# Patient Record
Sex: Female | Born: 1985 | Race: Asian | Hispanic: No | Marital: Single | Smoking: Current some day smoker
Health system: Southern US, Community
[De-identification: ages and names within clinical notes are randomized; demographics above are authoritative.]

## PROBLEM LIST (undated history)

## (undated) DIAGNOSIS — R011 Cardiac murmur, unspecified: Secondary | ICD-10-CM

## (undated) DIAGNOSIS — F419 Anxiety disorder, unspecified: Secondary | ICD-10-CM

## (undated) DIAGNOSIS — E785 Hyperlipidemia, unspecified: Secondary | ICD-10-CM

## (undated) DIAGNOSIS — F329 Major depressive disorder, single episode, unspecified: Secondary | ICD-10-CM

## (undated) DIAGNOSIS — F32A Depression, unspecified: Secondary | ICD-10-CM

## (undated) DIAGNOSIS — T7840XA Allergy, unspecified, initial encounter: Secondary | ICD-10-CM

## (undated) DIAGNOSIS — I10 Essential (primary) hypertension: Secondary | ICD-10-CM

## (undated) HISTORY — DX: Major depressive disorder, single episode, unspecified: F32.9

## (undated) HISTORY — PX: PREPATELLAR BURSA EXCISION: SUR547

## (undated) HISTORY — DX: Cardiac murmur, unspecified: R01.1

## (undated) HISTORY — DX: Hyperlipidemia, unspecified: E78.5

## (undated) HISTORY — DX: Morbid (severe) obesity due to excess calories: E66.01

## (undated) HISTORY — DX: Depression, unspecified: F32.A

## (undated) HISTORY — DX: Essential (primary) hypertension: I10

## (undated) HISTORY — PX: EYE SURGERY: SHX253

## (undated) HISTORY — DX: Allergy, unspecified, initial encounter: T78.40XA

---

## 2006-12-22 ENCOUNTER — Ambulatory Visit: Payer: Self-pay | Admitting: Family Medicine

## 2006-12-24 ENCOUNTER — Ambulatory Visit: Payer: Self-pay | Admitting: *Deleted

## 2007-02-02 ENCOUNTER — Ambulatory Visit: Payer: Self-pay | Admitting: Family Medicine

## 2007-04-14 ENCOUNTER — Ambulatory Visit: Payer: Self-pay | Admitting: Internal Medicine

## 2007-07-27 ENCOUNTER — Encounter (INDEPENDENT_AMBULATORY_CARE_PROVIDER_SITE_OTHER): Payer: Self-pay | Admitting: *Deleted

## 2007-07-28 ENCOUNTER — Ambulatory Visit: Payer: Self-pay | Admitting: Internal Medicine

## 2007-07-28 LAB — CONVERTED CEMR LAB
HDL: 58 mg/dL (ref >39)
LDL Cholesterol: 127 mg/dL — ABNORMAL HIGH (ref 0–99)
Triglycerides: 85 mg/dL (ref <150)
VLDL: 17 mg/dL (ref 0–40)

## 2007-08-02 ENCOUNTER — Ambulatory Visit: Payer: Self-pay | Admitting: Internal Medicine

## 2007-10-27 ENCOUNTER — Ambulatory Visit: Payer: Self-pay | Admitting: Internal Medicine

## 2007-10-27 ENCOUNTER — Encounter (INDEPENDENT_AMBULATORY_CARE_PROVIDER_SITE_OTHER): Payer: Self-pay | Admitting: Family Medicine

## 2007-10-27 LAB — CONVERTED CEMR LAB
Cholesterol: 222 mg/dL — ABNORMAL HIGH (ref 0–200)
HDL: 65 mg/dL (ref >39)
LDL Cholesterol: 147 mg/dL — ABNORMAL HIGH (ref 0–99)
Total CHOL/HDL Ratio: 3.4
Triglycerides: 49 mg/dL (ref <150)
VLDL: 10 mg/dL (ref 0–40)

## 2008-01-24 ENCOUNTER — Ambulatory Visit: Payer: Self-pay | Admitting: Internal Medicine

## 2008-01-24 ENCOUNTER — Encounter (INDEPENDENT_AMBULATORY_CARE_PROVIDER_SITE_OTHER): Payer: Self-pay | Admitting: Family Medicine

## 2008-01-24 LAB — CONVERTED CEMR LAB
ALT: 14 units/L (ref 0–35)
BUN: 9 mg/dL (ref 6–23)
Basophils Absolute: 0 10*3/uL (ref 0.0–0.1)
Basophils Relative: 0 % (ref 0–1)
CO2: 22 meq/L (ref 19–32)
Chloride: 100 meq/L (ref 96–112)
Eosinophils Absolute: 0 10*3/uL (ref 0.0–0.7)
Eosinophils Relative: 1 % (ref 0–5)
Glucose, Bld: 99 mg/dL (ref 70–99)
LDL Cholesterol: 78 mg/dL (ref 0–99)
Lymphs Abs: 1.9 10*3/uL (ref 0.7–4.0)
MCV: 88.3 fL (ref 78.0–100.0)
Monocytes Absolute: 0.4 10*3/uL (ref 0.1–1.0)
Neutrophils Relative %: 65 % (ref 43–77)
RBC: 4.7 M/uL (ref 3.87–5.11)
Sodium: 137 meq/L (ref 135–145)
Total CHOL/HDL Ratio: 2.5
Total Protein: 7.6 g/dL (ref 6.0–8.3)
Triglycerides: 68 mg/dL (ref <150)
VLDL: 14 mg/dL (ref 0–40)
WBC: 6.7 10*3/uL (ref 4.0–10.5)

## 2008-07-30 ENCOUNTER — Emergency Department (HOSPITAL_COMMUNITY): Admission: EM | Admit: 2008-07-30 | Discharge: 2008-07-30 | Payer: Self-pay | Admitting: Emergency Medicine

## 2008-09-04 ENCOUNTER — Encounter (INDEPENDENT_AMBULATORY_CARE_PROVIDER_SITE_OTHER): Payer: Self-pay | Admitting: Adult Health

## 2008-09-04 ENCOUNTER — Ambulatory Visit: Payer: Self-pay | Admitting: Internal Medicine

## 2008-09-04 LAB — CONVERTED CEMR LAB
Alkaline Phosphatase: 76 units/L (ref 39–117)
BUN: 12 mg/dL (ref 6–23)
CO2: 21 meq/L (ref 19–32)
Chloride: 100 meq/L (ref 96–112)
Eosinophils Relative: 1 % (ref 0–5)
Glucose, Bld: 87 mg/dL (ref 70–99)
HCT: 39.9 % (ref 36.0–46.0)
Lymphs Abs: 2.2 10*3/uL (ref 0.7–4.0)
MCHC: 33.8 g/dL (ref 30.0–36.0)
MCV: 85.3 fL (ref 78.0–100.0)
Monocytes Absolute: 0.4 10*3/uL (ref 0.1–1.0)
Neutrophils Relative %: 62 % (ref 43–77)
Potassium: 4.1 meq/L (ref 3.5–5.3)
RBC: 4.68 M/uL (ref 3.87–5.11)
RDW: 13 % (ref 11.5–15.5)
Sodium: 138 meq/L (ref 135–145)
TSH: 1.725 microintl units/mL (ref 0.350–4.50)
Total Protein: 7.4 g/dL (ref 6.0–8.3)
WBC: 7 10*3/uL (ref 4.0–10.5)

## 2008-09-24 ENCOUNTER — Ambulatory Visit: Payer: Self-pay | Admitting: Internal Medicine

## 2008-09-25 ENCOUNTER — Ambulatory Visit: Payer: Self-pay | Admitting: Internal Medicine

## 2008-10-08 ENCOUNTER — Emergency Department (HOSPITAL_COMMUNITY): Admission: EM | Admit: 2008-10-08 | Discharge: 2008-10-08 | Payer: Self-pay | Admitting: Emergency Medicine

## 2008-10-10 ENCOUNTER — Inpatient Hospital Stay (HOSPITAL_COMMUNITY): Admission: AD | Admit: 2008-10-10 | Discharge: 2008-10-10 | Payer: Self-pay | Admitting: Obstetrics & Gynecology

## 2008-10-10 ENCOUNTER — Ambulatory Visit: Payer: Self-pay | Admitting: Physician Assistant

## 2008-10-11 ENCOUNTER — Ambulatory Visit: Payer: Self-pay | Admitting: Internal Medicine

## 2008-10-18 ENCOUNTER — Ambulatory Visit: Payer: Self-pay | Admitting: Internal Medicine

## 2008-10-25 ENCOUNTER — Ambulatory Visit: Payer: Self-pay | Admitting: Internal Medicine

## 2008-11-13 ENCOUNTER — Ambulatory Visit: Payer: Self-pay | Admitting: Internal Medicine

## 2008-11-21 ENCOUNTER — Ambulatory Visit: Payer: Self-pay | Admitting: Internal Medicine

## 2008-11-22 ENCOUNTER — Ambulatory Visit: Payer: Self-pay | Admitting: Obstetrics and Gynecology

## 2008-11-27 ENCOUNTER — Ambulatory Visit: Payer: Self-pay | Admitting: Internal Medicine

## 2008-11-29 ENCOUNTER — Ambulatory Visit (HOSPITAL_COMMUNITY): Admission: RE | Admit: 2008-11-29 | Discharge: 2008-11-29 | Payer: Self-pay | Admitting: Family Medicine

## 2009-01-03 ENCOUNTER — Ambulatory Visit: Payer: Self-pay | Admitting: Internal Medicine

## 2009-01-03 ENCOUNTER — Encounter (INDEPENDENT_AMBULATORY_CARE_PROVIDER_SITE_OTHER): Payer: Self-pay | Admitting: Adult Health

## 2009-01-03 LAB — CONVERTED CEMR LAB
ALT: 12 U/L
AST: 11 U/L
Albumin: 4.1 g/dL
Alkaline Phosphatase: 66 U/L
BUN: 11 mg/dL
Basophils Absolute: 0 K/uL
Basophils Relative: 0 %
CO2: 20 meq/L
Calcium: 9 mg/dL
Chloride: 103 meq/L
Cholesterol: 145 mg/dL
Creatinine, Ser: 0.46 mg/dL
Eosinophils Absolute: 0.1 K/uL
Eosinophils Relative: 1 %
Glucose, Bld: 115 mg/dL — ABNORMAL HIGH
HCT: 39 %
HDL: 63 mg/dL
Hemoglobin: 12.6 g/dL
LDL Cholesterol: 68 mg/dL
Lymphocytes Relative: 31 %
Lymphs Abs: 2 K/uL
MCHC: 32.3 g/dL
MCV: 87.2 fL
Monocytes Absolute: 0.5 K/uL
Monocytes Relative: 7 %
Neutro Abs: 4 K/uL
Neutrophils Relative %: 61 %
Platelets: 353 K/uL
Potassium: 4.4 meq/L
RBC: 4.47 M/uL
RDW: 13.5 %
Sodium: 138 meq/L
TSH: 1.622 u[IU]/mL
Total Bilirubin: 0.4 mg/dL
Total CHOL/HDL Ratio: 2.3
Total Protein: 6.9 g/dL
Triglycerides: 71 mg/dL
VLDL: 14 mg/dL
WBC: 6.6 10*3/microliter

## 2009-01-17 ENCOUNTER — Ambulatory Visit: Payer: Self-pay | Admitting: Family Medicine

## 2009-02-01 ENCOUNTER — Ambulatory Visit: Payer: Self-pay | Admitting: Internal Medicine

## 2009-05-03 ENCOUNTER — Ambulatory Visit: Payer: Self-pay | Admitting: Internal Medicine

## 2009-06-12 ENCOUNTER — Encounter: Admission: RE | Admit: 2009-06-12 | Discharge: 2009-09-10 | Payer: Self-pay | Admitting: Adult Health

## 2009-08-02 ENCOUNTER — Encounter (INDEPENDENT_AMBULATORY_CARE_PROVIDER_SITE_OTHER): Payer: Self-pay | Admitting: Adult Health

## 2009-08-02 ENCOUNTER — Ambulatory Visit: Payer: Self-pay | Admitting: Internal Medicine

## 2009-08-02 LAB — CONVERTED CEMR LAB
ALT: 71 units/L — ABNORMAL HIGH (ref 0–35)
AST: 64 units/L — ABNORMAL HIGH (ref 0–37)
BUN: 12 mg/dL (ref 6–23)
CO2: 21 meq/L (ref 19–32)
Calcium: 9.4 mg/dL (ref 8.4–10.5)
Creatinine, Ser: 0.57 mg/dL (ref 0.40–1.20)
HDL: 47 mg/dL (ref >39)
Total Bilirubin: 0.4 mg/dL (ref 0.3–1.2)
Total Protein: 6.8 g/dL (ref 6.0–8.3)
Triglycerides: 101 mg/dL (ref <150)

## 2009-08-08 ENCOUNTER — Encounter (INDEPENDENT_AMBULATORY_CARE_PROVIDER_SITE_OTHER): Payer: Self-pay | Admitting: Adult Health

## 2009-08-08 LAB — CONVERTED CEMR LAB: Hep A IgM: NEGATIVE

## 2009-08-09 ENCOUNTER — Ambulatory Visit: Payer: Self-pay | Admitting: Family Medicine

## 2009-09-04 ENCOUNTER — Ambulatory Visit: Payer: Self-pay | Admitting: Internal Medicine

## 2009-09-04 ENCOUNTER — Encounter (INDEPENDENT_AMBULATORY_CARE_PROVIDER_SITE_OTHER): Payer: Self-pay | Admitting: Adult Health

## 2009-09-04 LAB — CONVERTED CEMR LAB
AST: 58 units/L — ABNORMAL HIGH (ref 0–37)
Albumin: 3.9 g/dL (ref 3.5–5.2)
BUN: 11 mg/dL (ref 6–23)
Eosinophils Relative: 1 % (ref 0–5)
HCT: 41.2 % (ref 36.0–46.0)
Helicobacter Pylori Antibody-IgG: <0.4
Hemoglobin: 13 g/dL (ref 12.0–15.0)
Lymphocytes Relative: 20 % (ref 12–46)
MCHC: 31.6 g/dL (ref 30.0–36.0)
MCV: 89.6 fL (ref 78.0–100.0)
Monocytes Absolute: 0.5 10*3/uL (ref 0.1–1.0)
Neutrophils Relative %: 73 % (ref 43–77)
Platelets: 365 10*3/uL (ref 150–400)
Potassium: 4.5 meq/L (ref 3.5–5.3)
RBC: 4.6 M/uL (ref 3.87–5.11)
RDW: 13 % (ref 11.5–15.5)
Total Protein: 6.9 g/dL (ref 6.0–8.3)

## 2009-09-24 ENCOUNTER — Ambulatory Visit: Payer: Self-pay | Admitting: Internal Medicine

## 2009-10-29 ENCOUNTER — Ambulatory Visit: Payer: Self-pay | Admitting: Internal Medicine

## 2009-10-29 ENCOUNTER — Encounter (INDEPENDENT_AMBULATORY_CARE_PROVIDER_SITE_OTHER): Payer: Self-pay | Admitting: Adult Health

## 2009-10-29 LAB — CONVERTED CEMR LAB
AST: 39 units/L — ABNORMAL HIGH (ref 0–37)
Alkaline Phosphatase: 85 units/L (ref 39–117)
Cholesterol: 222 mg/dL — ABNORMAL HIGH (ref 0–200)
HDL: 59 mg/dL (ref >39)
Hgb A1c MFr Bld: 8.5 % — ABNORMAL HIGH (ref 4.6–6.1)
Potassium: 4.2 meq/L (ref 3.5–5.3)
Sodium: 136 meq/L (ref 135–145)
Total Bilirubin: 0.5 mg/dL (ref 0.3–1.2)
Triglycerides: 117 mg/dL (ref <150)

## 2009-12-04 ENCOUNTER — Encounter (INDEPENDENT_AMBULATORY_CARE_PROVIDER_SITE_OTHER): Payer: Self-pay | Admitting: Adult Health

## 2009-12-04 ENCOUNTER — Ambulatory Visit: Payer: Self-pay | Admitting: Internal Medicine

## 2009-12-04 LAB — CONVERTED CEMR LAB: Hgb A1c MFr Bld: 10.4 % — ABNORMAL HIGH (ref 4.6–6.1)

## 2009-12-30 ENCOUNTER — Ambulatory Visit: Payer: Self-pay | Admitting: Internal Medicine

## 2009-12-30 ENCOUNTER — Encounter (INDEPENDENT_AMBULATORY_CARE_PROVIDER_SITE_OTHER): Payer: Self-pay | Admitting: Adult Health

## 2009-12-30 LAB — CONVERTED CEMR LAB
AST: 43 units/L — ABNORMAL HIGH (ref 0–37)
CO2: 21 meq/L (ref 19–32)
Glucose, Bld: 207 mg/dL — ABNORMAL HIGH (ref 70–99)
Potassium: 4.4 meq/L (ref 3.5–5.3)
Sodium: 136 meq/L (ref 135–145)
Total Bilirubin: 0.4 mg/dL (ref 0.3–1.2)

## 2010-01-14 ENCOUNTER — Ambulatory Visit: Payer: Self-pay | Admitting: Internal Medicine

## 2010-03-04 ENCOUNTER — Ambulatory Visit: Payer: Self-pay | Admitting: Internal Medicine

## 2010-03-18 ENCOUNTER — Ambulatory Visit: Payer: Self-pay | Admitting: Internal Medicine

## 2010-03-27 ENCOUNTER — Encounter: Admission: RE | Admit: 2010-03-27 | Discharge: 2010-03-27 | Payer: Self-pay | Admitting: Adult Health

## 2010-04-01 ENCOUNTER — Ambulatory Visit: Payer: Self-pay | Admitting: Internal Medicine

## 2010-05-28 ENCOUNTER — Ambulatory Visit: Payer: Self-pay | Admitting: Internal Medicine

## 2010-07-30 ENCOUNTER — Emergency Department (HOSPITAL_COMMUNITY): Admission: EM | Admit: 2010-07-30 | Discharge: 2010-07-30 | Payer: Self-pay | Admitting: Family Medicine

## 2010-09-29 ENCOUNTER — Encounter (INDEPENDENT_AMBULATORY_CARE_PROVIDER_SITE_OTHER): Payer: Self-pay | Admitting: *Deleted

## 2010-09-29 LAB — CONVERTED CEMR LAB
Albumin: 4.1 g/dL (ref 3.5–5.2)
BUN: 8 mg/dL (ref 6–23)
Calcium: 9.5 mg/dL (ref 8.4–10.5)
Chloride: 103 meq/L (ref 96–112)
Hemoglobin: 14 g/dL (ref 12.0–15.0)
LDL Cholesterol: 127 mg/dL — ABNORMAL HIGH (ref 0–99)
MCV: 83.1 fL (ref 78.0–100.0)
Platelets: 402 10*3/uL — ABNORMAL HIGH (ref 150–400)
Potassium: 4.3 meq/L (ref 3.5–5.3)
RBC: 4.97 M/uL (ref 3.87–5.11)
Sodium: 138 meq/L (ref 135–145)
TSH: 1.785 microintl units/mL (ref 0.350–4.500)
Total Protein: 6.9 g/dL (ref 6.0–8.3)
VLDL: 23 mg/dL (ref 0–40)

## 2010-10-16 ENCOUNTER — Ambulatory Visit (HOSPITAL_COMMUNITY)
Admission: RE | Admit: 2010-10-16 | Discharge: 2010-10-16 | Payer: Self-pay | Source: Home / Self Care | Attending: Family Medicine | Admitting: Family Medicine

## 2010-10-27 ENCOUNTER — Emergency Department (HOSPITAL_COMMUNITY)
Admission: EM | Admit: 2010-10-27 | Discharge: 2010-10-27 | Payer: Self-pay | Source: Home / Self Care | Admitting: Emergency Medicine

## 2010-10-28 ENCOUNTER — Encounter (INDEPENDENT_AMBULATORY_CARE_PROVIDER_SITE_OTHER): Payer: Self-pay | Admitting: *Deleted

## 2010-10-28 LAB — CONVERTED CEMR LAB: HCV Ab: NEGATIVE

## 2010-11-30 ENCOUNTER — Encounter: Payer: Self-pay | Admitting: *Deleted

## 2010-12-05 ENCOUNTER — Emergency Department (HOSPITAL_COMMUNITY)
Admission: EM | Admit: 2010-12-05 | Discharge: 2010-12-05 | Payer: Self-pay | Source: Home / Self Care | Admitting: Family Medicine

## 2010-12-05 LAB — POCT I-STAT, CHEM 8
Creatinine, Ser: 0.9 mg/dL (ref 0.4–1.2)
Potassium: 4.1 mEq/L (ref 3.5–5.1)
Sodium: 131 mEq/L — ABNORMAL LOW (ref 135–145)
TCO2: 16 mmol/L (ref 0–100)

## 2011-02-12 ENCOUNTER — Encounter: Payer: Self-pay | Attending: Adult Health | Admitting: *Deleted

## 2011-02-12 DIAGNOSIS — Z713 Dietary counseling and surveillance: Secondary | ICD-10-CM | POA: Insufficient documentation

## 2011-02-12 DIAGNOSIS — E119 Type 2 diabetes mellitus without complications: Secondary | ICD-10-CM | POA: Insufficient documentation

## 2011-03-16 ENCOUNTER — Ambulatory Visit: Payer: Self-pay | Admitting: *Deleted

## 2011-03-23 ENCOUNTER — Ambulatory Visit: Payer: Self-pay | Admitting: *Deleted

## 2011-03-24 NOTE — Group Therapy Note (Signed)
NAME:  Rita Hogan, Rita Hogan NO.:  [id number]   MEDICAL RECORD NO.:  [id number]          PATIENT TYPE:  WOC   LOCATION:  [facility]                   FACILITY:  [facility]   PHYSICIAN:  Argentina Donovan, MD        DATE OF BIRTH:  10-May-1986   DATE OF SERVICE:  11/22/2008                                  CLINIC NOTE   The patient is a 25 year old Caucasian female who came into the  maternity admissions on December 2nd with severe left lower quadrant  pain.  She had been into [facility] 2 days earlier with the same  complaint, had ultrasound at both places which showed a small 3 cm  ovarian cyst, probably functional, on that side.  The patient is a  morbidly obese patient, being 5 feet 1 inch tall, weighing 277 pounds.  She is virginal and nulliparous.  She has many medical problems  including diabetes, bronchial asthma, depression, PMS symptoms and  gastric reflux.  She works two jobs and lives in a small apartment and  mother lives with her.  Her mother is bipolar, severe, with Crohn's  disease and the patient, I think, does have a stressful life.  She had  irregular periods with severe PMS symptoms up until 2 years ago when she  went on Desogen birth control pills, but lately she has not been  regularly withdrawing, although she states that at least once every  other month she does have a period.  The symptoms of her cyst have  gotten much better and I have discussed functional vs. tumor cysts of  the ovary with the patient.  I told her whether or not to go up in  dosage on her birth control pill would depend on whether or not she had  recurrent episodes of ovarian cysts, but since this is the first episode  that she has had we will keep her on the move the dose and medicine she  is on because the Desogen has controlled her PMS symptoms and has made  her periods less painful and more regular.  We are going to repeat the  ultrasound, it has been 6 weeks since the previous one, and  see if there  is any change.  She will come back in 2 weeks for results and at that  time will decide whether or not we have to up the dose of her oral  contraceptives.   IMPRESSION:  1. Pelvic pain secondary to functional ovarian cyst.  2. History of dysmenorrhea and premenstrual syndrome, controlled by      oral contraceptives.  3. Type 1 diabetes mellitus.  4. Bronchial asthma.  5. Gastric reflux.  6. Emotionally labile with depression.           ______________________________  Argentina Donovan, MD     PR/MEDQ  D:  11/22/2008  T:  11/22/2008  Job:  [id number]

## 2011-04-08 ENCOUNTER — Ambulatory Visit: Payer: Self-pay | Admitting: *Deleted

## 2011-04-13 ENCOUNTER — Ambulatory Visit (INDEPENDENT_AMBULATORY_CARE_PROVIDER_SITE_OTHER): Payer: Self-pay

## 2011-04-13 ENCOUNTER — Inpatient Hospital Stay (INDEPENDENT_AMBULATORY_CARE_PROVIDER_SITE_OTHER)
Admission: RE | Admit: 2011-04-13 | Discharge: 2011-04-13 | Disposition: A | Discharge status: Home / Self Care | Payer: Self-pay | Source: Ambulatory Visit | Attending: Emergency Medicine | Admitting: Emergency Medicine

## 2011-04-13 DIAGNOSIS — S8000XA Contusion of unspecified knee, initial encounter: Secondary | ICD-10-CM

## 2011-04-23 ENCOUNTER — Ambulatory Visit: Payer: Self-pay | Admitting: *Deleted

## 2011-06-15 ENCOUNTER — Ambulatory Visit: Payer: Self-pay | Admitting: *Deleted

## 2011-07-09 ENCOUNTER — Encounter: Payer: Self-pay | Attending: Family Medicine | Admitting: *Deleted

## 2011-07-09 ENCOUNTER — Encounter: Payer: Self-pay | Admitting: *Deleted

## 2011-07-09 DIAGNOSIS — E119 Type 2 diabetes mellitus without complications: Secondary | ICD-10-CM | POA: Insufficient documentation

## 2011-07-09 DIAGNOSIS — Z713 Dietary counseling and surveillance: Secondary | ICD-10-CM | POA: Insufficient documentation

## 2011-07-09 NOTE — Progress Notes (Signed)
  Medical Nutrition Therapy:  Appt start time: 1230 end time:  1300.  Assessment:  Primary concerns today: diabetes management/weight management. Pt has lost 14 lbs since last visit at [facility] in April of 2012. She reports that since school has started she has been eating more regularly and has been walking to class everyday. She recently moved out of a house that was contributing to depression symptoms and poor lifestyles. She feels like she is back on track yet has reported elevated glucose level which are contributing to frequent yeast infections.  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 She is in college, attends classes early, and is currently studying Audiological scientist.  MEDICATIONS: See medications in list. Pt reports that she is out of Novolog/Actos Met and cannot afford to refill it at this time. She is only taking Lantus and Janivia at this point.  DIETARY INTAKE: Pt reports that she is receiving a meal card from the university. She notes that she is not counting carbohydrates yet trying to focus more on portion sizes and meal timing  24-hr recall:  B (6-7 AM): Special K Bar OR Granola Bar w/ yogurt cup  L (12-1 PM): Meal @ Cafeteria at [employer]: Meat, vegetable, starch  OR Deli sandwich w/ chips Snk (4 PM): Granola Bar D (6-8 PM): Shredded cabbage, Rice/Pasta OR Can of soup OR 2 pc wheat bread, egg (2)   Beverages: Crystal Light, Diet soda, water  Recent physical activity: No structured exercise; Walking some on the college campus  Estimated energy needs: 1800-2000 calories 200-225 g carbohydrates 75-80 g protein 50-60 g fat  Lab Results  Component Value Date   HGBA1C 10.4* 12/04/2009   CBG monitoring: "I check whenever I have test strips" Pt reports that she currently can't afford them CBG results: No results reported  Progress Towards Goal(s):  In progress.   Nutritional Diagnosis:  [place]-2.2 Altered nutrition-related laboratory As related to  elevated glucose levels.  As evidenced by glucose levels ranging >200-300 per pt.    Intervention:  Nutrition education.  Monitoring/Evaluation:  Dietary intake, exercise, glucose levels, and body weight in 3 month(s).

## 2011-07-09 NOTE — Patient Instructions (Addendum)
Goals:  Follow Diabetes "Plate Method" Plan  Eat 3 meals and 2 snacks, every 3-5 hrs   Limit carbohydrate intake to 30-45 grams carbohydrate/meal (*When reading labels/restaurants)  Limit carbohydrate intake to 15 grams carbohydrate/snack  Add lean protein foods to meals/snacks   Monitor glucose levels as instructed by your doctor  Aim for 30 mins of physical activity daily  Bring food record and glucose log to your next nutrition visit

## 2011-07-21 ENCOUNTER — Inpatient Hospital Stay (INDEPENDENT_AMBULATORY_CARE_PROVIDER_SITE_OTHER)
Admission: RE | Admit: 2011-07-21 | Discharge: 2011-07-21 | Disposition: A | Discharge status: Home / Self Care | Payer: Self-pay | Source: Ambulatory Visit | Attending: Family Medicine | Admitting: Family Medicine

## 2011-07-21 DIAGNOSIS — L538 Other specified erythematous conditions: Secondary | ICD-10-CM

## 2011-07-21 DIAGNOSIS — N760 Acute vaginitis: Secondary | ICD-10-CM

## 2011-08-10 LAB — GLUCOSE, CAPILLARY: Glucose-Capillary: 104 — ABNORMAL HIGH

## 2011-08-11 LAB — POCT URINALYSIS DIP (DEVICE)
Nitrite: NEGATIVE
pH: 6.5 (ref 5.0–8.0)

## 2011-09-08 ENCOUNTER — Encounter: Payer: Self-pay | Admitting: *Deleted

## 2011-09-08 ENCOUNTER — Encounter: Payer: Self-pay | Attending: Family Medicine | Admitting: *Deleted

## 2011-09-08 DIAGNOSIS — Z713 Dietary counseling and surveillance: Secondary | ICD-10-CM | POA: Insufficient documentation

## 2011-09-08 DIAGNOSIS — E119 Type 2 diabetes mellitus without complications: Secondary | ICD-10-CM | POA: Insufficient documentation

## 2011-09-08 NOTE — Patient Instructions (Addendum)
Goals:  Follow Diabetes "Plate Method" Plan  Eat 3 meals and 2 snacks, every 3-5 hrs   Limit carbohydrate intake to 30-45 grams carbohydrate/meal (*When reading labels/restaurants)  Limit carbohydrate intake to 15 grams carbohydrate/snack  Add lean protein foods to meals/snacks   Monitor glucose levels as instructed by your doctor  Aim for 30 mins of physical activity daily  Bring food record and glucose log to your next nutrition visit 

## 2011-09-08 NOTE — Progress Notes (Signed)
  Medical Nutrition Therapy: Appt start time: 1221 end time: 1254.   Assessment: Primary concerns today: diabetes management/weight management. Pt has lost 16 lbs since last visit at [facility] in April of 2012 and 2 lbs since visit in July 2012.  MEDICATIONS: See medications in list. Pt reports that she is taking her Amaryl, Januvia, and 60 units of Lantus. Pt notes that she cannot afford her test strips, is not checking glucose levels, and is not taking any Novolog. She also notes that she does not want to "gain weight with insulin" which is another reason she does not take it.  DIETARY INTAKE: Rita Hogan reports that she has not been getting her food stamps so she has been limited with what and how much she can buy. She notes that she ran out of money on her meal card at school so she notes that eating has not been great.  24-hr recall:  B (6-7 AM): 2 pkg oatmeal (sugar-sweetened) made with water L (12-1 PM): Soup or Chili (lean beef, beans, tomatoes) Snk (4 PM): Granola Bar OR Chocolate candy OR Baked chips D (6-8 PM): Baked chicken (3 drumsticks), yams (1 cup) OR Pasta (tortillini dishes)  Beverages: Crystal Light, Diet soda, water, coffee w/ sugar-free creamer = 30-40 oz  Recent physical activity: No structured exercise; Walking some on the college campus and to/from bus stop  Estimated energy needs:  1800-2000 calories  200-225 g carbohydrates  75-80 g protein  50-60 g fat   Lab Results  Component Value Date   HGBA1C 10.4* 12/04/2009    CBG monitoring: "I check whenever I have test strips" Pt reports that she currently can't afford them  CBG results: No results reported   Progress Towards Goal(s): In progress.   Nutritional Diagnosis:  -2.2 Altered nutrition-related laboratory As related to elevated glucose levels. As evidenced by glucose levels ranging >200-300 per pt (when checked the 4-5 times over the past 2 months).  Excessive carbohydrate intake related to large portions  of carbohydrate foods as evidenced by pt consuming >75% of total intake from carbohydrate foods.  Intervention: Nutrition education and reinforcement.   Monitoring/Evaluation: Dietary intake, exercise, glucose levels, and body weight in 2-3 month(s).

## 2011-10-08 ENCOUNTER — Other Ambulatory Visit: Payer: Self-pay | Admitting: Internal Medicine

## 2011-10-08 DIAGNOSIS — R51 Headache: Secondary | ICD-10-CM

## 2011-10-12 ENCOUNTER — Ambulatory Visit (HOSPITAL_COMMUNITY)
Admission: RE | Admit: 2011-10-12 | Discharge: 2011-10-12 | Disposition: A | Discharge status: Home / Self Care | Payer: Self-pay | Source: Ambulatory Visit | Attending: Internal Medicine | Admitting: Internal Medicine

## 2011-10-12 DIAGNOSIS — R51 Headache: Secondary | ICD-10-CM | POA: Insufficient documentation

## 2011-10-12 LAB — CREATININE, SERUM: GFR calc non Af Amer: >90 mL/min (ref >90)

## 2011-10-12 MED ORDER — IOHEXOL 300 MG/ML  SOLN
100.0000 mL | Freq: Once | INTRAMUSCULAR | Status: AC | PRN
  Administered 2011-10-12: 80 mL via INTRAVENOUS

## 2012-03-09 ENCOUNTER — Other Ambulatory Visit (HOSPITAL_COMMUNITY): Payer: Self-pay | Admitting: Family Medicine

## 2012-03-09 DIAGNOSIS — M719 Bursopathy, unspecified: Secondary | ICD-10-CM

## 2012-03-14 ENCOUNTER — Other Ambulatory Visit (HOSPITAL_COMMUNITY): Payer: Self-pay

## 2012-03-16 ENCOUNTER — Encounter (HOSPITAL_COMMUNITY): Payer: Self-pay

## 2012-03-16 ENCOUNTER — Ambulatory Visit (HOSPITAL_COMMUNITY)
Admission: RE | Admit: 2012-03-16 | Discharge: 2012-03-16 | Disposition: A | Discharge status: Home / Self Care | Payer: Self-pay | Source: Ambulatory Visit | Attending: Family Medicine | Admitting: Family Medicine

## 2012-03-16 ENCOUNTER — Emergency Department (HOSPITAL_COMMUNITY)
Admission: EM | Admit: 2012-03-16 | Discharge: 2012-03-16 | Disposition: A | Discharge status: Home / Self Care | Payer: Self-pay | Source: Home / Self Care | Attending: Emergency Medicine | Admitting: Emergency Medicine

## 2012-03-16 DIAGNOSIS — M25469 Effusion, unspecified knee: Secondary | ICD-10-CM | POA: Insufficient documentation

## 2012-03-16 DIAGNOSIS — M65839 Other synovitis and tenosynovitis, unspecified forearm: Secondary | ICD-10-CM

## 2012-03-16 DIAGNOSIS — Z9181 History of falling: Secondary | ICD-10-CM | POA: Insufficient documentation

## 2012-03-16 DIAGNOSIS — M25569 Pain in unspecified knee: Secondary | ICD-10-CM | POA: Insufficient documentation

## 2012-03-16 DIAGNOSIS — M719 Bursopathy, unspecified: Secondary | ICD-10-CM

## 2012-03-16 MED ORDER — METHYLPREDNISOLONE ACETATE 40 MG/ML IJ SUSP
40.0000 mg | Freq: Once | INTRAMUSCULAR | Status: AC
  Administered 2012-03-16: 40 mg via INTRA_ARTICULAR

## 2012-03-16 MED ORDER — METHYLPREDNISOLONE ACETATE 40 MG/ML IJ SUSP
INTRAMUSCULAR | Status: AC
  Filled 2012-03-16: qty 5

## 2012-03-16 NOTE — ED Notes (Signed)
C/o rt wrist pain since March.  States has seen her PCP, using ice and ibuprofen without improvement.  Denies any injury

## 2012-03-16 NOTE — ED Provider Notes (Signed)
Chief Complaint  Patient presents with  . Wrist Pain    History of Present Illness:   The patient is a 26 year old female who has a history of pain in the right wrist since March. She denies any injury or swelling. The pain is worse with movement of the thumb. There is no numbness or tingling. The pain is localized over the extensor tendon of the thumb, particularly over the radial styloid. [facility], a week ago, and was told to use ice and anti-inflammatories, but then she was told that she had stage I chronic kidney disease. She is a diabetic and is on insulin.  Review of Systems:  Other than noted above, the patient denies any of the following symptoms: Systemic:  No fevers, chills, sweats, or aches.  No fatigue or tiredness. Musculoskeletal:  No joint pain, arthritis, bursitis, swelling, back pain, or neck pain. Neurological:  No muscular weakness, paresthesias, headache, or trouble with speech or coordination.  No dizziness.   PMFSH:  Past medical history, family history, social history, meds, and allergies were reviewed.  Physical Exam:   Vital signs:  BP 122/68  Pulse 94  Temp(Src) 98.8 F (37.1 C) (Oral)  Resp 20  SpO2 100%  LMP 02/29/2012 Gen:  Alert and oriented times 3.  In no distress. Musculoskeletal:  There was pain to palpation over the extensor tendon of the thumb overlying the radial styloid. Finkelstein's test was positive. The wrist has a full range of motion. There was no pain to palpation over the carpal tunnel.  Otherwise, all joints had a full a ROM with no swelling, bruising or deformity.  No edema, pulses full. Extremities were warm and pink.  Capillary refill was brisk.  Skin:  Clear, warm and dry.  No rash. Neuro:  Alert and oriented times 3.  Muscle strength was normal.  Sensation was intact to light touch.   Procedure Note:  Verbal informed consent was obtained from the patient.  Risks and benefits were outlined with  the patient.  Patient understands and accepts these risks.  Identity of the patient was confirmed verbally and by armband.    Procedure was performed as followed:  The point of maximal tenderness was identified overlying the radial styloid. The area was prepped with Betadine and alcohol and anesthetized with ethyl chloride spray. 1 cc of Depo-Medrol 40 mg strength and 1 cc of 2% Xylocaine were injected overlying the extensor tendon of the thumb and the patient tolerated this well without any complications.  Patient tolerated the procedure well without any immediate complications.   Assessment:  The encounter diagnosis was Hand tendonitis. She has declined his synovitis.  Plan:   1.  The following meds were prescribed:   New Prescriptions   No medications on file   2.  The patient was instructed in symptomatic care, including rest and activity, elevation, application of ice and compression.  Appropriate handouts were given. 3.  The patient was told to return if becoming worse in any way, if no better in 3 or 4 days, and given some red flag symptoms that would indicate earlier return.   4.  The patient was told to follow up if no better in 2 weeks.  At that point she would probably need a referral to a hand surgeon.   [name], MD 03/16/12 [id number]

## 2012-03-16 NOTE — Discharge Instructions (Signed)
Tendinitis  Tendinitis is swelling and inflammation of the tendons. Tendons are band-like tissues that connect muscle to bone. Tendinitis commonly occurs in the:    Shoulders (rotator cuff).   Heels (Achilles tendon).   Elbows (triceps tendon).  CAUSES  Tendinitis is usually caused by overusing the tendon, muscles, and joints involved. When the tissue surrounding a tendon (synovium) becomes inflamed, it is called tenosynovitis. Tendinitis commonly develops in people whose jobs require repetitive motions.  SYMPTOMS   Pain.   Tenderness.   Mild swelling.  DIAGNOSIS  Tendinitis is usually diagnosed by physical exam. Your caregiver may also order X-rays or other imaging tests.  TREATMENT  Your caregiver may recommend certain medicines or exercises for your treatment.  HOME CARE INSTRUCTIONS    Use a sling or splint for as long as directed by your caregiver until the pain decreases.   Put ice on the injured area.   Put ice in a plastic bag.   Place a towel between your skin and the bag.   Leave the ice on for 15 to 20 minutes, 3 to 4 times a day.   Avoid using the limb while the tendon is painful. Perform gentle range of motion exercises only as directed by your caregiver. Stop exercises if pain or discomfort increase, unless directed otherwise by your caregiver.   Only take over-the-counter or prescription medicines for pain, discomfort, or fever as directed by your caregiver.  SEEK MEDICAL CARE IF:    Your pain and swelling increase.   You develop new, unexplained symptoms, especially increased numbness in the hands.  MAKE SURE YOU:    Understand these instructions.   Will watch your condition.   Will get help right away if you are not doing well or get worse.  Document Released: 10/23/2000 Document Revised: 10/15/2011 Document Reviewed: 01/12/2011  [employer] Patient Information 2012 [employer].

## 2012-04-13 ENCOUNTER — Encounter: Payer: Self-pay | Admitting: Physician Assistant

## 2012-04-13 ENCOUNTER — Ambulatory Visit (INDEPENDENT_AMBULATORY_CARE_PROVIDER_SITE_OTHER): Payer: Self-pay | Admitting: Physician Assistant

## 2012-04-13 VITALS — BP 130/92 | HR 86 | Temp 98.0°F | Ht 61.25 in | Wt 303.8 lb

## 2012-04-13 DIAGNOSIS — Z124 Encounter for screening for malignant neoplasm of cervix: Secondary | ICD-10-CM

## 2012-04-13 NOTE — Patient Instructions (Addendum)
Pap Test A Pap test is a sampling of cells from a woman's cervix. The cervix is the opening between the vagina (birth canal) and the uterus (the bottom part of the womb). The cells are scraped from the cervix during a pelvic exam. These cells are then looked at under a microscope to see if the cells are normal or to see if a cancer is developing or there are changes that suggest a cancer will develop. Cervical dysplasia is a condition in which a woman has abnormal changes in the top layer of cells of her cervix. These changes are an early sign that cervical cancer may develop. Pap tests also look for the human papilloma virus (HPV) because it has 4 types that are responsible for 70% of cervical cancer. Infections can also be found during a Pap test such as bacteria, fungus, protozoa and viruses.  Cervical cancer is harder to treat and less likely to have a good outcome if left untreated. Catching the disease at an early stage leads to a better outcome. Since the Pap test was introduced 60 years ago, deaths from cervical cancer have decreased by 70%. Every woman should keep up to date with Pap tests. RISK FACTORS FOR CERVICAL CANCER INCLUDE:   Becoming sexually active before age 69.   Being the daughter of a woman who took diethylstilbestrol (DES) during pregnancy.   Having a sexual partner who has or has had cancer of the penis.   Having a sexual partner whose past partner had cervical cancer or cervical dysplasia (early cell changes which suggest a cancer may develop).   Having a weakened immune system. An example would be HIV or other immunodeficiency disorder.   Having had a sexually transmitted infection such as chlamydia, gonorrhea or HPV.   Having had an abnormal Pap or cancer of the vagina or vulva.   Having had more than one sexual partner.   A history of cervical cancer in a woman's sister or mother.   Not using condoms with new sexual partners.   Smoking.  WHO SHOULD HAVE PAP  TESTS  A Pap test is done to screen for cervical cancer.   The first Pap test should be done at age 50.   Between ages 33 and 77, Pap tests are repeated every 2 years.   Beginning at age 75, you are advised to have a Pap test every 3 years as long as your past 3 Pap tests have been normal.   Some women have medical problems that increase the chance of getting cervical cancer. Talk to your caregiver about these problems. It is especially important to talk to your caregiver if a new problem develops soon after your last Pap test. In these cases, your caregiver may recommend more frequent screening and Pap tests.   The above recommendations are the same for women who have or have not gotten the vaccine for HPV (Human Papillomavirus).   If you had a hysterectomy for a problem that was not a cancer or a condition that could lead to cancer, then you no longer need Pap tests. However, even if you no longer need a Pap test, a regular exam is a good idea to make sure no other problems are starting.    If you are between ages 71 and 24, and you have had normal Pap tests going back 10 years, you no longer need Pap tests. However, even if you no longer need a Pap test, a regular exam is a good idea  to make sure no other problems are starting.    If you have had past treatment for cervical cancer or a condition that could lead to cancer, you need Pap tests and screening for cancer for at least 20 years after your treatment.   If Pap tests have been discontinued, risk factors (such as a new sexual partner) need to be re-assessed to determine if screening should be resumed.   Some women may need screenings more often if they are at high risk for cervical cancer.  PREPARATION FOR A PAP TEST A Pap test should be performed during the weeks before the start of menstruation. Women should not douche or have sexual intercourse for 24 hours before the test. No vaginal creams, diaphragms, or tampons should be  used for 24 hours before the test. To minimize discomfort, a woman should empty her bladder just before the exam. TAKING THE PAP TEST The caregiver will perform a pelvic exam. A metal or plastic instrument (speculum) is placed in the vagina. This is done before your caregiver does a bimanual exam of your internal female organs. This instrument allows your caregiver to see the inside of the vagina and look at the cervix. A small, sterile brush is used to take a sample of cells from the internal opening of the cervix. A small wooden spatula is used to scrape the outside of the cervix. Neither of these two methods to collect cells will cause you pain. These two scrapings are placed on a glass slide or in a small bottle filled with a special liquid. The cells are looked at later under a microscope in a lab. A specialist will look at these cells and determine if the cells are normal. RESULTS OF YOUR PAP TEST  A healthy Pap test shows no abnormal cells or evidence of inflammation.   The presence of abnormally growing cells on the surface of the cervix may be reported as an abnormal Pap test. Different categories of findings are used to describe your Pap test. Your caregiver will go over the importance of these findings with you. The caregiver will then determine what follow-up is needed or when you should have your next pap test.   If you have had two or more abnormal Pap tests:   You may be asked to have a colposcopy. This is a test in which the cervix is viewed with a special lighted microscope.   A cervical tissue sample (biopsy) may also be needed. This involves taking a small tissue sample from the cervix. The sample is looked at under a microscope to find the cause of the abnormal cells. Make sure you find out the results of the Pap test. If you have not received the results within two weeks, contact your caregiver's office for the results. Do not assume everything is normal if you have not heard from  your caregiver or medical facility. It is important to follow up on all of your test results.  Document Released: 01/16/2003 Document Revised: 10/15/2011 Document Reviewed: 10/20/2011 [facility] Patient Information 2012 [place], [place].Intrauterine Device Information An intrauterine device (IUD) is inserted into your uterus and prevents pregnancy. There are 2 types of IUDs available:  Copper IUD. This type of IUD is wrapped in copper wire and is placed inside the uterus. Copper makes the uterus and fallopian tubes produce a fluid that kills sperm. The copper IUD can stay in place for 10 years.   Hormone IUD. This type of IUD contains the hormone progestin (synthetic progesterone).  The hormone thickens the cervical mucus and prevents sperm from entering the uterus, and it also thins the uterine lining to prevent implantation of a fertilized egg. The hormone can weaken or kill the sperm that get into the uterus. The hormone IUD can stay in place for 5 years.  Your caregiver will make sure you are a good candidate for a contraceptive IUD. Discuss with your caregiver the possible side effects. ADVANTAGES  It is highly effective, reversible, long-acting, and low maintenance.   There are no estrogen-related side effects.   An IUD can be used when breastfeeding.   It is not associated with weight gain.   It works immediately after insertion.   The copper IUD does not interfere with your female hormones.   The progesterone IUD can make heavy menstrual periods lighter.   The progesterone IUD can be used for 5 years.   The copper IUD can be used for 10 years.  DISADVANTAGES  The progesterone IUD can be associated with irregular bleeding patterns.   The copper IUD can make your menstrual flow heavier and more painful.   You may experience cramping and vaginal bleeding after insertion.  Document Released: 09/29/2004 Document Revised: 10/15/2011 Document Reviewed: 02/28/2011 [facility] Patient  Information 2012 [place], [place].

## 2012-05-09 ENCOUNTER — Telehealth: Payer: Self-pay

## 2012-05-09 NOTE — Telephone Encounter (Signed)
Pt called returning a phone call she received from the Clinic about test results and would like a call back

## 2012-05-09 NOTE — Telephone Encounter (Signed)
Pt informed of her pap smear result.

## 2012-05-09 NOTE — Telephone Encounter (Signed)
Pt called for pap results.  Called pt and left message to return our call to the clinics.

## 2012-05-24 ENCOUNTER — Ambulatory Visit: Payer: Self-pay | Admitting: *Deleted

## 2012-05-25 ENCOUNTER — Emergency Department (HOSPITAL_COMMUNITY)
Admission: EM | Admit: 2012-05-25 | Discharge: 2012-05-25 | Disposition: A | Discharge status: Home / Self Care | Payer: Self-pay | Source: Home / Self Care | Attending: Emergency Medicine | Admitting: Emergency Medicine

## 2012-05-25 ENCOUNTER — Encounter (HOSPITAL_COMMUNITY): Payer: Self-pay | Admitting: Emergency Medicine

## 2012-05-25 ENCOUNTER — Emergency Department (INDEPENDENT_AMBULATORY_CARE_PROVIDER_SITE_OTHER): Payer: Self-pay

## 2012-05-25 DIAGNOSIS — S8000XA Contusion of unspecified knee, initial encounter: Secondary | ICD-10-CM

## 2012-05-25 DIAGNOSIS — S93409A Sprain of unspecified ligament of unspecified ankle, initial encounter: Secondary | ICD-10-CM

## 2012-05-25 DIAGNOSIS — S8002XA Contusion of left knee, initial encounter: Secondary | ICD-10-CM

## 2012-05-25 MED ORDER — MELOXICAM 7.5 MG PO TABS: 7.5000 mg | ORAL_TABLET | Freq: Every day | ORAL | Status: DC

## 2012-05-25 NOTE — ED Notes (Signed)
Unknown last tetanus

## 2012-05-25 NOTE — ED Notes (Signed)
Reports right ankle "gave away".  Patient then landed on left knee.  Open wound.  Reports history of injury to knee in the past.

## 2012-05-25 NOTE — ED Notes (Signed)
Initially cleansed wound with water and then with sur-clenz

## 2012-05-25 NOTE — ED Provider Notes (Signed)
History     CSN: [id number]  Arrival date & time 05/25/12  1510   First MD Initiated Contact with Patient 05/25/12 1618      Chief Complaint  Patient presents with  . Fall    (Consider location/radiation/quality/duration/timing/severity/associated sxs/prior treatment) HPI Comments: Patient presents urgent care complaining that this is his second fall in the last 2 weeks. She was walking in her right ankle just roll over and she landed on her left knee. She describes that she suffers from bursitis of her left knee. Describes tenderness with walking on the lateral aspect of her right ankle and discomfort when she walks on the anterior aspect of her left knee. She'll sustained some superficial cuts to her left knee. Patient denies any other symptoms such as numbness, tingling sensation or weakness of her lower extremities.  Patient is a 26 y.o. female presenting with fall. The history is provided by the patient.  Fall The accident occurred 6 to 12 hours ago. Incident: While walking. She fell from a height of 1 to 2 ft. She landed on concrete. The volume of blood lost was minimal. The point of impact was the left knee (Right ankle portion). The pain is moderate. She was ambulatory at the scene. There was no entrapment after the fall. There was no drug use involved in the accident. There was no alcohol use involved in the accident. Pertinent negatives include no fever and no numbness. The symptoms are aggravated by cold. She has tried nothing for the symptoms. The treatment provided no relief.    Past Medical History  Diagnosis Date  . Diabetes mellitus   . Hypertension   . Renal disorder   . Asthma     Past Surgical History  Procedure Date  . Eye surgery   . Prepatellar bursa excision     right    Family History  Problem Relation Age of Onset  . Adopted: Yes    History  Substance Use Topics  . Smoking status: Current Everyday Smoker  . Smokeless tobacco: Not on file  .  Alcohol Use: Yes     rare    OB History    Grav Para Term Preterm Abortions TAB SAB Ect Mult Living   0               Review of Systems  Constitutional: Positive for activity change. Negative for fever, appetite change and fatigue.  Musculoskeletal: Positive for joint swelling. Negative for arthralgias.  Skin: Positive for wound.  Neurological: Negative for weakness and numbness.    Allergies  Hctz; Metformin and related; and Oxycodone  Home Medications   Current Outpatient Rx  Name Route Sig Dispense Refill  . BENAZEPRIL HCL PO Oral Take 40 mg by mouth.     . BUPROPION HCL ER (XL) 300 MG PO TB24 Oral Take 300 mg by mouth daily.      . CELEXA PO Oral Take 20 mg by mouth.     Marland Kitchen FLUCONAZOLE 150 MG PO TABS Oral Take 150 mg by mouth once a week.    Marland Kitchen GLIMEPIRIDE 1 MG PO TABS Oral Take 1 mg by mouth 2 (two) times daily.     Marland Kitchen VISTARIL PO Oral Take 25 mg by mouth.     . INSULIN ASPART 100 UNIT/ML [place] SOLN Subcutaneous Inject into the skin 3 (three) times daily before meals. Pt reports she is using a sliding scale yet rarely checks CBG's     . INSULIN GLARGINE 100 UNIT/ML  [place] SOLN Subcutaneous Inject 60 Units into the skin at bedtime.      Marland Kitchen XYZAL PO Oral Take by mouth.    Marland Kitchen LORATADINE 10 MG PO TABS Oral Take 10 mg by mouth daily.    . MELOXICAM 7.5 MG PO TABS Oral Take 1 tablet (7.5 mg total) by mouth daily. 14 tablet 0  . SINGULAIR PO Oral Take by mouth.    Suzzanne Cloud ESTRADIOL 0.25-35 MG-MCG PO TABS Oral Take 1 tablet by mouth daily.    Marland Kitchen PROTONIX PO Oral Take by mouth.    Marland Kitchen SITAGLIPTIN PHOSPHATE 100 MG PO TABS Oral Take 100 mg by mouth daily.      . TRAZODONE HCL PO Oral Take 50 mg by mouth.       BP 130/72  Pulse 105  Temp 98.3 F (36.8 C) (Oral)  Resp 18  SpO2 100%  LMP 04/28/2012  Physical Exam  Nursing note and vitals reviewed. Constitutional: Vital signs are normal.  Non-toxic appearance. She does not have a sickly appearance. No distress.    Musculoskeletal: She exhibits tenderness. She exhibits no edema.       Legs: Neurological: She is alert.  Skin: Rash noted.    ED Course  Procedures (including critical care time)  Labs Reviewed - No data to display Dg Ankle Complete Right  05/25/2012  *RADIOLOGY REPORT*  Clinical Data: Pain post fall, obesity  RIGHT ANKLE - COMPLETE 3+ VIEW  Comparison: None.  Findings: Ankle mortise intact. Negative for fracture, dislocation, or other acute abnormality.  Normal alignment and mineralization. No significant degenerative change.  Regional soft tissues unremarkable.  IMPRESSION:  Negative  Original Report Authenticated By: Osa Craver, M.D.   Dg Knee Complete 4 Views Left  05/25/2012  *RADIOLOGY REPORT*  Clinical Data: Pain post fall, obesity.  LEFT KNEE - COMPLETE 4+ VIEW  Comparison: 03/16/2012  Findings: No effusion. Negative for fracture, dislocation, or other acute abnormality.  Normal alignment and mineralization. No significant degenerative change.  Regional soft tissues unremarkable.  IMPRESSION:  Negative  Original Report Authenticated By: Thora Lance III, M.D.     1. Contusion of knee, left   2. Ankle sprain       MDM  Patient with a left knee contusion and abrasions, and a coexistent right ankle sprain. X-rays were unremarkable. Conservative treatment with RICE discussed as well as instructed to followup with her primary care Dr. if discomfort was to persist on her left knee after 2 weeks. A prescription for a meloxicam course for 14 days was generated.        Jimmie Molly, MD 05/25/12 2131

## 2012-08-09 ENCOUNTER — Emergency Department (HOSPITAL_COMMUNITY): Payer: Self-pay

## 2012-08-09 ENCOUNTER — Emergency Department (HOSPITAL_COMMUNITY)
Admission: EM | Admit: 2012-08-09 | Discharge: 2012-08-10 | Disposition: A | Discharge status: Home / Self Care | Payer: Self-pay | Attending: Emergency Medicine | Admitting: Emergency Medicine

## 2012-08-09 DIAGNOSIS — R059 Cough, unspecified: Secondary | ICD-10-CM | POA: Insufficient documentation

## 2012-08-09 DIAGNOSIS — E119 Type 2 diabetes mellitus without complications: Secondary | ICD-10-CM | POA: Insufficient documentation

## 2012-08-09 DIAGNOSIS — G5600 Carpal tunnel syndrome, unspecified upper limb: Secondary | ICD-10-CM | POA: Insufficient documentation

## 2012-08-09 DIAGNOSIS — G5602 Carpal tunnel syndrome, left upper limb: Secondary | ICD-10-CM

## 2012-08-09 DIAGNOSIS — J45909 Unspecified asthma, uncomplicated: Secondary | ICD-10-CM | POA: Insufficient documentation

## 2012-08-09 DIAGNOSIS — I10 Essential (primary) hypertension: Secondary | ICD-10-CM | POA: Insufficient documentation

## 2012-08-09 DIAGNOSIS — R05 Cough: Secondary | ICD-10-CM

## 2012-08-09 LAB — COMPREHENSIVE METABOLIC PANEL
ALT: 31 U/L (ref 0–35)
AST: 24 U/L (ref 0–37)
CO2: 25 mEq/L (ref 19–32)
Chloride: 95 mEq/L — ABNORMAL LOW (ref 96–112)
Creatinine, Ser: 0.48 mg/dL — ABNORMAL LOW (ref 0.50–1.10)
GFR calc Af Amer: >90 mL/min (ref >90)
GFR calc non Af Amer: >90 mL/min (ref >90)
Glucose, Bld: 197 mg/dL — ABNORMAL HIGH (ref 70–99)
Sodium: 136 mEq/L (ref 135–145)
Total Bilirubin: 0.3 mg/dL (ref 0.3–1.2)

## 2012-08-09 LAB — URINALYSIS, ROUTINE W REFLEX MICROSCOPIC
Bilirubin Urine: NEGATIVE
Glucose, UA: NEGATIVE mg/dL
Hgb urine dipstick: NEGATIVE
Specific Gravity, Urine: 1.019 (ref 1.005–1.030)
Urobilinogen, UA: 0.2 mg/dL (ref 0.0–1.0)
pH: 6.5 (ref 5.0–8.0)

## 2012-08-09 LAB — CBC WITH DIFFERENTIAL/PLATELET
Basophils Absolute: 0 10*3/uL (ref 0.0–0.1)
Eosinophils Relative: 1 % (ref 0–5)
HCT: 40.6 % (ref 36.0–46.0)
Hemoglobin: 13.8 g/dL (ref 12.0–15.0)
Lymphocytes Relative: 24 % (ref 12–46)
Lymphs Abs: 2.9 10*3/uL (ref 0.7–4.0)
MCV: 82.5 fL (ref 78.0–100.0)
Monocytes Absolute: 0.6 10*3/uL (ref 0.1–1.0)
Neutro Abs: 8.2 10*3/uL — ABNORMAL HIGH (ref 1.7–7.7)
RBC: 4.92 MIL/uL (ref 3.87–5.11)
RDW: 12.5 % (ref 11.5–15.5)
WBC: 11.9 10*3/uL — ABNORMAL HIGH (ref 4.0–10.5)

## 2012-08-09 LAB — PREGNANCY, URINE: Preg Test, Ur: NEGATIVE

## 2012-08-09 NOTE — ED Provider Notes (Signed)
History     CSN: [id number]  Arrival date & time 08/09/12  1622   First MD Initiated Contact with Patient 08/09/12 2226      Chief Complaint  Patient presents with  . multiple complaints     (Consider location/radiation/quality/duration/timing/severity/associated sxs/prior treatment) HPI  26 y.o. female INAD c/o productive cough x5 weeks, with fever, and SOB. Pt also reports a pins and needles parsathesia wit  Bilateral upper extremitis x3 months/ Pt also reports a resolved LLE swelling x3 months   Past Medical History  Diagnosis Date  . Diabetes mellitus   . Hypertension   . Renal disorder   . Asthma     Past Surgical History  Procedure Date  . Eye surgery   . Prepatellar bursa excision     right    Family History  Problem Relation Age of Onset  . Adopted: Yes    History  Substance Use Topics  . Smoking status: Current Every Day Smoker  . Smokeless tobacco: Not on file  . Alcohol Use: Yes     rare    OB History    Grav Para Term Preterm Abortions TAB SAB Ect Mult Living   0               Review of Systems  Constitutional: Negative for fever.  Respiratory: Positive for cough. Negative for shortness of breath.   Cardiovascular: Negative for chest pain.  Gastrointestinal: Negative for nausea, vomiting, abdominal pain and diarrhea.  All other systems reviewed and are negative.    Allergies  Metformin and related; Other; and Oxycodone  Home Medications   Current Outpatient Rx  Name Route Sig Dispense Refill  . ASPIRIN EC 81 MG PO TBEC Oral Take 81 mg by mouth daily.    Marland Kitchen BENAZEPRIL HCL 40 MG PO TABS Oral Take 40 mg by mouth daily.    . BUPROPION HCL ER (SR) 150 MG PO TB12 Oral Take 300 mg by mouth daily.    Marland Kitchen CITALOPRAM HYDROBROMIDE 20 MG PO TABS Oral Take 20 mg by mouth daily.    Marland Kitchen FLUCONAZOLE 150 MG PO TABS Oral Take 150 mg by mouth See admin instructions. Every 7 days as needed for yeast infection    . HYDROCHLOROTHIAZIDE 25 MG PO TABS Oral  Take 25 mg by mouth daily.    Marland Kitchen HYDROXYZINE PAMOATE 25 MG PO CAPS Oral Take 50 mg by mouth at bedtime.    . INSULIN REGULAR HUMAN (CONC) 500 UNIT/ML [place] SOLN Subcutaneous Inject 65-125 Units into the skin 3 (three) times daily with meals. 125 units at breakfast, 65 units at lunch, and 100 units at dinner    . LORATADINE 10 MG PO TABS Oral Take 10 mg by mouth daily.    Marland Kitchen MONTELUKAST SODIUM 10 MG PO TABS Oral Take 10 mg by mouth daily.    Marland Kitchen NORGESTIMATE-ETH ESTRADIOL 0.25-35 MG-MCG PO TABS Oral Take 1 tablet by mouth daily.    Marland Kitchen PANTOPRAZOLE SODIUM 40 MG PO TBEC Oral Take 40 mg by mouth daily.    Marland Kitchen PRAVASTATIN SODIUM 40 MG PO TABS Oral Take 40 mg by mouth daily.    Marland Kitchen SITAGLIPTIN PHOSPHATE 100 MG PO TABS Oral Take 100 mg by mouth daily.    . TRAZODONE HCL 50 MG PO TABS Oral Take 50 mg by mouth at bedtime as needed. For sleep      BP 128/98  Pulse 105  Temp 98.6 F (37 C) (Oral)  Resp 16  SpO2  99%  LMP 07/10/2012  Physical Exam  Nursing note and vitals reviewed. Constitutional: She is oriented to person, place, and time. She appears well-developed and well-nourished. No distress.       Obese  HENT:  Head: Normocephalic and atraumatic.  Mouth/Throat: Oropharynx is clear and moist.  Eyes: Conjunctivae normal and EOM are normal. Pupils are equal, round, and reactive to light.  Neck: Normal range of motion. No JVD present.  Cardiovascular: Normal rate, regular rhythm, normal heart sounds and intact distal pulses.   Pulmonary/Chest: Effort normal and breath sounds normal. No stridor. No respiratory distress. She has no wheezes. She has no rales. She exhibits no tenderness.  Abdominal: Soft. Bowel sounds are normal. She exhibits no distension and no mass. There is no tenderness. There is no rebound and no guarding.  Musculoskeletal: Normal range of motion. She exhibits no edema and no tenderness.       Tinel and Phalen positive bilaterally  Neurological: She is alert and oriented to person,  place, and time.  Skin: Skin is warm.  Psychiatric: She has a normal mood and affect.    ED Course  Procedures (including critical care time)  Labs Reviewed  CBC WITH DIFFERENTIAL - Abnormal; Notable for the following:    WBC 11.9 (*)     Platelets 447 (*)     Neutro Abs 8.2 (*)     All other components within normal limits  COMPREHENSIVE METABOLIC PANEL - Abnormal; Notable for the following:    Chloride 95 (*)     Glucose, Bld 197 (*)     Creatinine, Ser 0.48 (*)     All other components within normal limits  URINALYSIS, ROUTINE W REFLEX MICROSCOPIC  PREGNANCY, URINE   Dg Chest 2 View  08/09/2012  *RADIOLOGY REPORT*  Clinical Data: :  With cough and shortness of breath for 5 weeks  CHEST - 2 VIEW  Comparison: 07/30/2010  Findings: The heart, mediastinum and hila are within normal limits. The lungs are clear.  No pleural effusion or pneumothorax.  Normal bony thorax and surrounding soft tissues.  IMPRESSION: Normal chest radiographs   Original Report Authenticated By: Domenic Moras, M.D.      1. 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 Chest x-ray is normal and lung sounds are clear patient refused cough suppression. Patient has positive Phalen and Tinel, I have advised decreasing repetitive movement, and I will give her wrist splint she has a right wrist splint.   Pt verbalized understanding and agrees with care plan. Outpatient follow-up and return precautions given.           Wynetta Emery, PA-C 08/10/12 [phone]

## 2012-08-09 NOTE — ED Notes (Signed)
She is c/o lower extremity swelling for 3 months and she has had a 40 lb weight gain.  She is in a drug study of diabetes since the 29th of July she has also had a cough  For 3 weeks alos.

## 2012-08-09 NOTE — ED Notes (Signed)
She was [facility] pt but only has an endocrinologist that will not see her for these symptoms

## 2012-08-09 NOTE — ED Notes (Addendum)
Pt reports cough x  5 weeks. Worse when lying down. Productive. Clear. Per patient. Chest soreness immediately after coughing. Denies SOB.  Reports "loss of sensations in hands" bilaterally x 2 months. Reports numbness and tingling that radiates upwards in left arm. In endocrinology medical trail at [facility] for type 2 DM and insulin resistance. A.O. X 4. Respirations even and regular. Vitals stable.

## 2012-08-10 NOTE — ED Notes (Signed)
Ortho paged. 

## 2012-08-10 NOTE — ED Provider Notes (Signed)
Medical screening examination/treatment/procedure(s) were performed by non-physician practitioner and as supervising physician I was immediately available for consultation/collaboration.   Carleene Cooper III, MD 08/10/12 1329

## 2012-08-14 ENCOUNTER — Encounter (HOSPITAL_COMMUNITY): Payer: Self-pay

## 2012-08-17 ENCOUNTER — Emergency Department (HOSPITAL_COMMUNITY)
Admission: EM | Admit: 2012-08-17 | Discharge: 2012-08-18 | Disposition: A | Discharge status: Home / Self Care | Payer: Self-pay | Attending: Emergency Medicine | Admitting: Emergency Medicine

## 2012-08-17 ENCOUNTER — Encounter (HOSPITAL_COMMUNITY): Payer: Self-pay | Admitting: Emergency Medicine

## 2012-08-17 ENCOUNTER — Emergency Department (HOSPITAL_COMMUNITY): Payer: Self-pay

## 2012-08-17 DIAGNOSIS — R0602 Shortness of breath: Secondary | ICD-10-CM | POA: Insufficient documentation

## 2012-08-17 DIAGNOSIS — Z79899 Other long term (current) drug therapy: Secondary | ICD-10-CM | POA: Insufficient documentation

## 2012-08-17 DIAGNOSIS — J45909 Unspecified asthma, uncomplicated: Secondary | ICD-10-CM | POA: Insufficient documentation

## 2012-08-17 DIAGNOSIS — R609 Edema, unspecified: Secondary | ICD-10-CM | POA: Insufficient documentation

## 2012-08-17 DIAGNOSIS — E119 Type 2 diabetes mellitus without complications: Secondary | ICD-10-CM | POA: Insufficient documentation

## 2012-08-17 DIAGNOSIS — Z7982 Long term (current) use of aspirin: Secondary | ICD-10-CM | POA: Insufficient documentation

## 2012-08-17 DIAGNOSIS — I10 Essential (primary) hypertension: Secondary | ICD-10-CM | POA: Insufficient documentation

## 2012-08-17 DIAGNOSIS — R059 Cough, unspecified: Secondary | ICD-10-CM | POA: Insufficient documentation

## 2012-08-17 DIAGNOSIS — R05 Cough: Secondary | ICD-10-CM | POA: Insufficient documentation

## 2012-08-17 NOTE — ED Notes (Signed)
Pt presented to ED with leg swelling.

## 2012-08-17 NOTE — ED Notes (Addendum)
Pt reports having swelling to bil legs and increased SOB; swelling is noted to bil feet, CMS intact; checked BS 1.5 hours ago was 110

## 2012-08-17 NOTE — ED Provider Notes (Signed)
History     CSN: [id number]  Arrival date & time 08/17/12  2038   First MD Initiated Contact with Patient 08/17/12 2220      Chief Complaint  Patient presents with  . Leg Swelling    (Consider location/radiation/quality/duration/timing/severity/associated sxs/prior treatment) HPI Comments: Patient presents today with a chief complaint of bilateral lower extremity edema.  She reports that she has had swelling of her left lower extremity for the past 2 weeks, but today began having swelling of her right lower extremity.  She denies any erythema or warmth.  She denies any history of DVT or PE.  No surgery or prolonged travel in the past 4 weeks.  She is not on any estrogen containing medications.  She does take HCTZ for blood pressure.  She also reports that she has has had shortness of breath and cough over the past week.  Symptoms progressively worsening.  Symptoms not associated with any chest pain.  No wheezing.    The history is provided by the patient.    Past Medical History  Diagnosis Date  . Diabetes mellitus   . Hypertension   . Asthma     Past Surgical History  Procedure Date  . Eye surgery   . Prepatellar bursa excision     right    Family History  Problem Relation Age of Onset  . Adopted: Yes    History  Substance Use Topics  . Smoking status: Former Smoker    Quit date: 07/13/2012  . Smokeless tobacco: Not on file  . Alcohol Use: Yes     rare    OB History    Grav Para Term Preterm Abortions TAB SAB Ect Mult Living   0               Review of Systems  Constitutional: Negative for fever and chills.  Respiratory: Positive for cough and shortness of breath. Negative for wheezing.   Cardiovascular: Positive for leg swelling. Negative for chest pain.  Musculoskeletal: Negative for gait problem.  Skin: Negative for color change.  Neurological: Negative for dizziness, syncope and light-headedness.  All other systems reviewed and are  negative.    Allergies  Metformin and related; Other; and Oxycodone  Home Medications   Current Outpatient Rx  Name Route Sig Dispense Refill  . ASPIRIN EC 81 MG PO TBEC Oral Take 81 mg by mouth daily.    Marland Kitchen BENAZEPRIL HCL 40 MG PO TABS Oral Take 40 mg by mouth daily.    . BUPROPION HCL ER (SR) 150 MG PO TB12 Oral Take 300 mg by mouth daily.    Marland Kitchen CITALOPRAM HYDROBROMIDE 20 MG PO TABS Oral Take 20 mg by mouth daily.    Marland Kitchen FLUCONAZOLE 150 MG PO TABS Oral Take 150 mg by mouth See admin instructions. Every 7 days as needed for yeast infection    . HYDROCHLOROTHIAZIDE 25 MG PO TABS Oral Take 25 mg by mouth daily.    Marland Kitchen HYDROXYZINE PAMOATE 25 MG PO CAPS Oral Take 50 mg by mouth at bedtime.    . INSULIN REGULAR HUMAN (CONC) 500 UNIT/ML [place] SOLN Subcutaneous Inject 65-125 Units into the skin 3 (three) times daily with meals. 125 units at breakfast, 65 units at lunch, and 100 units at dinner    . LORATADINE 10 MG PO TABS Oral Take 10 mg by mouth daily.    Marland Kitchen MONTELUKAST SODIUM 10 MG PO TABS Oral Take 10 mg by mouth daily.    Marland Kitchen NORGESTIMATE-ETH  ESTRADIOL 0.25-35 MG-MCG PO TABS Oral Take 1 tablet by mouth daily.    Marland Kitchen PANTOPRAZOLE SODIUM 40 MG PO TBEC Oral Take 40 mg by mouth daily.    Marland Kitchen PRAVASTATIN SODIUM 40 MG PO TABS Oral Take 40 mg by mouth daily.    Marland Kitchen SITAGLIPTIN PHOSPHATE 100 MG PO TABS Oral Take 100 mg by mouth daily.    . TRAZODONE HCL 50 MG PO TABS Oral Take 50 mg by mouth at bedtime as needed. For sleep      BP 106/47  Pulse 119  Temp 98.6 F (37 C) (Oral)  Resp 24  SpO2 99%  LMP 06/28/2012  Physical Exam  Nursing note and vitals reviewed. Constitutional: She appears well-developed and well-nourished. No distress.       Morbidly obese  HENT:  Head: Normocephalic and atraumatic.  Mouth/Throat: Oropharynx is clear and moist.  Cardiovascular: Normal rate, regular rhythm, normal heart sounds and intact distal pulses.   Pulmonary/Chest: Effort normal and breath sounds normal. No  respiratory distress. She has no wheezes. She has no rales. She exhibits no tenderness.  Musculoskeletal:       No erythema or warmth of lower extremities bilaterally Mild nonpitting edema of the lower extremities bilaterally from the mid shin distally Negative Homan's sign bilaterally.  Neurological: She is alert. No sensory deficit. Gait normal.  Skin: Skin is warm and dry. She is not diaphoretic. No erythema.  Psychiatric: She has a normal mood and affect.    ED Course  Procedures (including critical care time)  Labs Reviewed - No data to display Dg Chest 2 View  08/17/2012  *RADIOLOGY REPORT*  Clinical Data: Shortness of breath, cough  CHEST - 2 VIEW  Comparison: 08/09/2012; 07/30/2012  Findings: Examination is degraded secondary to patient body habitus.  Borderline enlarged cardiac silhouette.  Normal mediastinal contours.  No focal parenchymal opacities.  No pleural effusion or pneumothorax.  Unchanged bones.  IMPRESSION: No acute cardiopulmonary disease.  Specifically, no evidence of pneumonia.   Original Report Authenticated By: Waynard Reeds, M.D.      No diagnosis found.    MDM  Patient presenting with bilateral lower extremity edema.  Patient also complaining of SOB.  No acute findings on CXR.  BMP unremarkable with normal Creatine.  BNP unremarkable.  Hepatic function tests normal.  D-dimer negative.  Therefore, feel that patient can be discharged home.  Patient instructed to elevate both of her lower extremities and use compression stockings if needed.          Pascal Lux [place], PA-C 08/18/12 [phone]

## 2012-08-18 LAB — HEPATIC FUNCTION PANEL
ALT: 28 U/L (ref 0–35)
AST: 19 U/L (ref 0–37)
Albumin: 3.1 g/dL — ABNORMAL LOW (ref 3.5–5.2)

## 2012-08-18 LAB — POCT I-STAT, CHEM 8
BUN: 10 mg/dL (ref 6–23)
Creatinine, Ser: 0.6 mg/dL (ref 0.50–1.10)
Glucose, Bld: 226 mg/dL — ABNORMAL HIGH (ref 70–99)
Potassium: 3.4 mEq/L — ABNORMAL LOW (ref 3.5–5.1)
Sodium: 140 mEq/L (ref 135–145)

## 2012-08-18 LAB — D-DIMER, QUANTITATIVE: D-Dimer, Quant: 0.36 ug/mL-FEU (ref 0.00–0.48)

## 2012-08-18 NOTE — ED Notes (Signed)
Pt for discharge.Vital signs has been stable and no complaints.

## 2012-08-19 NOTE — ED Provider Notes (Signed)
Medical screening examination/treatment/procedure(s) were performed by non-physician practitioner and as supervising physician I was immediately available for consultation/collaboration.   Tija Biss, MD 08/19/12 0008 

## 2012-12-12 ENCOUNTER — Encounter: Payer: Self-pay | Admitting: Obstetrics & Gynecology

## 2012-12-12 NOTE — Progress Notes (Signed)
Patient is here today for pap smear only.  Pap smear done, candida panel added on given white discharge seen during exam.  She will return for Mirena placement once it is approved by [facility].

## 2012-12-12 NOTE — Progress Notes (Signed)
This encounter was created in error - please disregard.

## 2013-04-23 ENCOUNTER — Emergency Department (HOSPITAL_COMMUNITY)
Admission: EM | Admit: 2013-04-23 | Discharge: 2013-04-23 | Disposition: A | Discharge status: Home / Self Care | Payer: No Typology Code available for payment source | Source: Home / Self Care | Attending: Emergency Medicine | Admitting: Emergency Medicine

## 2013-04-23 ENCOUNTER — Encounter (HOSPITAL_COMMUNITY): Payer: Self-pay | Admitting: Emergency Medicine

## 2013-04-23 DIAGNOSIS — L732 Hidradenitis suppurativa: Secondary | ICD-10-CM

## 2013-04-23 DIAGNOSIS — S93409A Sprain of unspecified ligament of unspecified ankle, initial encounter: Secondary | ICD-10-CM

## 2013-04-23 DIAGNOSIS — S8000XA Contusion of unspecified knee, initial encounter: Secondary | ICD-10-CM

## 2013-04-23 MED ORDER — CEPHALEXIN 500 MG PO CAPS: 500.0000 mg | ORAL_CAPSULE | Freq: Three times a day (TID) | ORAL | Status: DC

## 2013-04-23 MED ORDER — CEFTRIAXONE SODIUM 1 G IJ SOLR
INTRAMUSCULAR | Status: AC
  Filled 2013-04-23: qty 10

## 2013-04-23 MED ORDER — KETOROLAC TROMETHAMINE 60 MG/2ML IM SOLN
INTRAMUSCULAR | Status: AC
  Filled 2013-04-23: qty 2

## 2013-04-23 MED ORDER — KETOROLAC TROMETHAMINE 60 MG/2ML IM SOLN
60.0000 mg | Freq: Once | INTRAMUSCULAR | Status: AC
  Administered 2013-04-23: 60 mg via INTRAMUSCULAR

## 2013-04-23 MED ORDER — CEFTRIAXONE SODIUM 1 G IJ SOLR
1.0000 g | Freq: Once | INTRAMUSCULAR | Status: AC
  Administered 2013-04-23: 1 g via INTRAMUSCULAR

## 2013-04-23 MED ORDER — SULFAMETHOXAZOLE-TMP DS 800-160 MG PO TABS: 2.0000 | ORAL_TABLET | Freq: Two times a day (BID) | ORAL | Status: DC

## 2013-04-23 MED ORDER — DICLOFENAC SODIUM 75 MG PO TBEC: 75.0000 mg | DELAYED_RELEASE_TABLET | Freq: Two times a day (BID) | ORAL | Status: DC

## 2013-04-23 MED ORDER — LIDOCAINE HCL (PF) 1 % IJ SOLN
INTRAMUSCULAR | Status: AC
  Filled 2013-04-23: qty 5

## 2013-04-23 NOTE — ED Notes (Signed)
Pt reports sores in groin area that has gradually gotten worse. Some drainage after using warm compresses.  Pt has tried ibuprofen for pain relief which is not helping. "groin area is painful to touch"

## 2013-04-23 NOTE — ED Provider Notes (Signed)
Chief Complaint:   Chief Complaint  Patient presents with  . Groin Pain    sores in groin area since 6/11. some drainage. painful to touch.     History of Present Illness:   Rita Hogan is a 27 year old female with diabetes, elevated cholesterol, obesity, and gastroesophageal reflux who presents with a four-day history of a painful abscess in her left groin. She's had similar abscesses often on for over a year. These have occurred in the axillas, under the breasts, in the skin fold areas, and the groin areas. She has seen her primary care physician for these. The abscess in groin has been draining. She denies any fever or chills.  Review of Systems:  Other than noted above, the patient denies any of the following symptoms: Systemic:  No fever, chills, sweats, weight loss, or fatigue. ENT:  No nasal congestion, rhinorrhea, sore throat, swelling of lips, tongue or throat. Resp:  No cough, wheezing, or shortness of breath. Skin:  No rash, itching, nodules, or suspicious lesions.  PMFSH:  Past medical history, family history, social history, meds, and allergies were reviewed. She is allergic to oxycodone and high doses of Actos and metformin. Currently she takes glimepiride, Januvia, trazodone, benazepril, Crestor, pantoprazole, and Vistaril.  Physical Exam:   Vital signs:  BP 143/87  Pulse 107  Temp(Src) 98.6 F (37 C) (Oral)  Resp 20  SpO2 97%  LMP 04/19/2013 Gen:  Alert, oriented, in no distress. ENT:  Pharynx clear, no intraoral lesions, moist mucous membranes. Lungs:  Clear to auscultation. Skin:  She has a 1 cm round necrotic area of in the left pubic area with some surrounding erythema. There was a small sinus tract adjacent to this there was purulent drainage coming from the sinus tract. With pressure, all the purulent material was evacuated. There was no fluctuance.  Course in Urgent Care Center:   Given Rocephin 1 g IM and Toradol 60 mg IM for pain.  Assessment:  The  encounter diagnosis was Hidradenitis suppurativa.  She has an abscess in the left groin which appears to have drained on its own. She was given IM Rocephin and oral antibiotics. I think she has hidradenitis suppurativa since she's had recurring infections in the groin and axillary areas. She was referred to see Dr. Para Skeans for ongoing followup.  Plan:   1.  The following meds were prescribed:   Discharge Medication List as of 04/23/2013  4:13 PM    START taking these medications   Details  cephALEXin (KEFLEX) 500 MG capsule Take 1 capsule (500 mg total) by mouth 3 (three) times daily., Starting 04/23/2013, Until Discontinued, Normal    diclofenac (VOLTAREN) 75 MG EC tablet Take 1 tablet (75 mg total) by mouth 2 (two) times daily., Starting 04/23/2013, Until Discontinued, Normal    sulfamethoxazole-trimethoprim (BACTRIM DS) 800-160 MG per tablet Take 2 tablets by mouth 2 (two) times daily., Starting 04/23/2013, Until Discontinued, Normal       2.  The patient was instructed in symptomatic care and handouts were given. 3.  The patient was told to return if becoming worse in any way, if no better in 3 or 4 days, and given some red flag symptoms such as worsening pain or swelling or fever that would indicate earlier return. 4.  Follow up with Dr. Para Skeans.     [name], MD 04/23/13 [phone]

## 2013-04-23 NOTE — ED Notes (Signed)
Pt given injection will discharge at 4:45

## 2013-04-27 LAB — CULTURE, ROUTINE-ABSCESS

## 2013-04-27 NOTE — ED Notes (Signed)
Abscess culture L groin: Mod. Staph. Aureus.  Pt. adequately treated with Bactrim DS and Keflex. Vassie Moselle 04/27/2013

## 2013-07-16 ENCOUNTER — Encounter (HOSPITAL_COMMUNITY): Payer: Self-pay

## 2013-07-16 ENCOUNTER — Emergency Department (HOSPITAL_COMMUNITY)
Admission: EM | Admit: 2013-07-16 | Discharge: 2013-07-16 | Disposition: A | Discharge status: Home / Self Care | Payer: No Typology Code available for payment source | Source: Home / Self Care

## 2013-07-16 DIAGNOSIS — M25569 Pain in unspecified knee: Secondary | ICD-10-CM

## 2013-07-16 DIAGNOSIS — M25562 Pain in left knee: Secondary | ICD-10-CM

## 2013-07-16 MED ORDER — ACETAMINOPHEN-CODEINE #3 300-30 MG PO TABS: 1.0000 | ORAL_TABLET | Freq: Four times a day (QID) | ORAL | Status: DC | PRN

## 2013-07-16 NOTE — ED Provider Notes (Signed)
CSN: [id number]     Arrival date & time 07/16/13  1257 History   First MD Initiated Contact with Patient 07/16/13 1328     Chief Complaint  Patient presents with  . Knee Pain   (Consider location/radiation/quality/duration/timing/severity/associated sxs/prior Treatment) HPI Comments: 27 year old shore and severely obese female is complaining of left knee pain for the past month. She was evaluated by her PCP approximately one and half weeks ago told her that her knee should be fine to limit weightbearing and use. For the past couple days the pain in these become more severe, especially with weightbearing. The pain is constant and occurs while at rest as well. She states there was sudden pain that developed in her left knee approximately one month ago [place]. Denies any type of blunt trauma or rotational forces.   Past Medical History  Diagnosis Date  . Diabetes mellitus   . Hypertension   . Asthma    Past Surgical History  Procedure Laterality Date  . Eye surgery    . Prepatellar bursa excision      right   Family History  Problem Relation Age of Onset  . Adopted: Yes   History  Substance Use Topics  . Smoking status: Former Smoker    Quit date: 07/13/2012  . Smokeless tobacco: Not on file  . Alcohol Use: Yes     Comment: rare   OB History   Grav Para Term Preterm Abortions TAB SAB Ect Mult Living   0              Review of Systems  Constitutional: Negative for fever, chills and activity change.  HENT: Negative.   Respiratory: Negative.   Cardiovascular: Negative.   Musculoskeletal:       As per HPI  Skin: Negative for color change, pallor and rash.  Neurological: Negative.     Allergies  Metformin and related; Other; and Oxycodone  Home Medications   Current Outpatient Rx  Name  Route  Sig  Dispense  Refill  . aspirin EC 81 MG tablet   Oral   Take 81 mg by mouth daily.         Marland Kitchen ibuprofen (ADVIL,MOTRIN) 200 MG tablet   Oral   Take  200 mg by mouth every 6 (six) hours as needed. For pain         . pantoprazole (PROTONIX) 40 MG tablet   Oral   Take 40 mg by mouth daily.         . pravastatin (PRAVACHOL) 40 MG tablet   Oral   Take 40 mg by mouth daily.         . sitaGLIPtin (JANUVIA) 100 MG tablet   Oral   Take 100 mg by mouth daily.         Marland Kitchen acetaminophen-codeine (TYLENOL #3) 300-30 MG per tablet   Oral   Take 1-2 tablets by mouth every 6 (six) hours as needed for pain.   20 tablet   0   . benazepril (LOTENSIN) 40 MG tablet   Oral   Take 40 mg by mouth daily.         Marland Kitchen buPROPion (WELLBUTRIN SR) 150 MG 12 hr tablet   Oral   Take 300 mg by mouth daily.         . cephALEXin (KEFLEX) 500 MG capsule   Oral   Take 1 capsule (500 mg total) by mouth 3 (three) times daily.   30 capsule  0   . citalopram (CELEXA) 20 MG tablet   Oral   Take 20 mg by mouth daily.         . diclofenac (VOLTAREN) 75 MG EC tablet   Oral   Take 1 tablet (75 mg total) by mouth 2 (two) times daily.   20 tablet   0   . fluconazole (DIFLUCAN) 150 MG tablet   Oral   Take 150 mg by mouth See admin instructions. Every 7 days as needed for yeast infection         . hydrochlorothiazide (HYDRODIURIL) 25 MG tablet   Oral   Take 25 mg by mouth daily.         . hydrOXYzine (VISTARIL) 25 MG capsule   Oral   Take 50 mg by mouth at bedtime.         . insulin regular human CONCENTRATED (HUMULIN R) 500 UNIT/ML SOLN injection   Subcutaneous   Inject 65-125 Units into the skin 3 (three) times daily with meals. 129 units at breakfast, 65 units at lunch, and 105 units at dinner         . loratadine (CLARITIN) 10 MG tablet   Oral   Take 10 mg by mouth daily.         . montelukast (SINGULAIR) 10 MG tablet   Oral   Take 10 mg by mouth daily.         . norgestimate-ethinyl estradiol (ORTHO-CYCLEN,SPRINTEC,PREVIFEM) 0.25-35 MG-MCG tablet   Oral   Take 1 tablet by mouth daily.         Marland Kitchen  sulfamethoxazole-trimethoprim (BACTRIM DS) 800-160 MG per tablet   Oral   Take 2 tablets by mouth 2 (two) times daily.   40 tablet   0   . traZODone (DESYREL) 50 MG tablet   Oral   Take 50 mg by mouth at bedtime as needed. For sleep          BP 121/86  Pulse 90  Temp(Src) 98.6 F (37 C) (Oral)  Resp 16  SpO2 100%  LMP 06/19/2013 Physical Exam  Nursing note and vitals reviewed. Constitutional: She is oriented to person, place, and time. She appears well-developed and well-nourished. No distress.  HENT:  Head: Normocephalic and atraumatic.  Eyes: EOM are normal. Pupils are equal, round, and reactive to light.  Neck: Normal range of motion. Neck supple.  Musculoskeletal:  Left knee without discoloration. No apparent swelling. There is limitation to flexion. Approximately 80. Extension produces pain in the anterior knee. The remainder of the pain is perceived in the posterior aspect of the knee. Tenderness in the posterior aspect including local tenderness. No tenderness to the anterior structures. Negative drawer, negative valgus, negative laxity  Lymphadenopathy:    She has no cervical adenopathy.  Neurological: She is alert and oriented to person, place, and time. No cranial nerve deficit.  Skin: Skin is warm and dry.  Psychiatric: She has a normal mood and affect.    ED Course  Procedures (including critical care time) Labs Review Labs Reviewed - No data to display Imaging Review No results found.  MDM   1. Left knee pain       Wear knee brace while ambulating into U. see your physician in a few days. Minimize weightbearing Tylenol #3 for pain. Patient states she can take this without allergic symptoms. Ice when necessary  [name], NP 07/16/13 1418is and   [name], NP 07/16/13 1420

## 2013-07-16 NOTE — ED Provider Notes (Signed)
Medical screening examination/treatment/procedure(s) were performed by resident physician or non-physician practitioner and as supervising physician I was immediately available for consultation/collaboration.   KINDL,JAMES DOUGLAS MD.   James D Kindl, MD 07/16/13 1603 

## 2013-07-16 NOTE — ED Notes (Signed)
Concern for pain in her left knee sine 8-10 , no better w rest

## 2014-01-16 ENCOUNTER — Encounter (HOSPITAL_COMMUNITY): Payer: Self-pay | Admitting: Emergency Medicine

## 2014-01-16 ENCOUNTER — Emergency Department (HOSPITAL_COMMUNITY)
Admission: EM | Admit: 2014-01-16 | Discharge: 2014-01-16 | Disposition: A | Discharge status: Home / Self Care | Payer: PRIVATE HEALTH INSURANCE | Source: Home / Self Care | Attending: Emergency Medicine | Admitting: Emergency Medicine

## 2014-01-16 ENCOUNTER — Emergency Department (INDEPENDENT_AMBULATORY_CARE_PROVIDER_SITE_OTHER): Payer: PRIVATE HEALTH INSURANCE

## 2014-01-16 DIAGNOSIS — J189 Pneumonia, unspecified organism: Secondary | ICD-10-CM

## 2014-01-16 MED ORDER — AZITHROMYCIN 250 MG PO TABS: 250.0000 mg | ORAL_TABLET | Freq: Every day | ORAL | Status: DC

## 2014-01-16 NOTE — ED Provider Notes (Signed)
CSN: [id number]     Arrival date & time 01/16/14  [id number] History   First MD Initiated Contact with Patient 01/16/14 0840     Chief Complaint  Patient presents with  . URI   (Consider location/radiation/quality/duration/timing/severity/associated sxs/prior Treatment) Patient is a 28 y.o. female presenting with URI. The history is provided by the patient.  URI Presenting symptoms: congestion, cough, fever, rhinorrhea and sore throat   Severity:  Moderate Onset quality:  Gradual Duration:  4 days Timing:  Constant Progression:  Unchanged Chronicity:  New Associated symptoms: myalgias   Associated symptoms: no arthralgias, no headaches, no neck pain, no sinus pain, no sneezing, no swollen glands and no wheezing     Past Medical History  Diagnosis Date  . Diabetes mellitus   . Hypertension   . Asthma    Past Surgical History  Procedure Laterality Date  . Eye surgery    . Prepatellar bursa excision      right   Family History  Problem Relation Age of Onset  . Adopted: Yes   History  Substance Use Topics  . Smoking status: Former Smoker    Quit date: 07/13/2012  . Smokeless tobacco: Not on file  . Alcohol Use: Yes     Comment: rare   OB History   Grav Para Term Preterm Abortions TAB SAB Ect Mult Living   0              Review of Systems  Constitutional: Positive for fever.  HENT: Positive for congestion, rhinorrhea and sore throat. Negative for sneezing.   Respiratory: Positive for cough. Negative for wheezing.   Musculoskeletal: Positive for myalgias. Negative for arthralgias and neck pain.  Neurological: Negative for headaches.  All other systems reviewed and are negative.    Allergies  Metformin and related; Other; and Oxycodone  Home Medications   Current Outpatient Rx  Name  Route  Sig  Dispense  Refill  . acetaminophen-codeine (TYLENOL #3) 300-30 MG per tablet   Oral   Take 1-2 tablets by mouth every 6 (six) hours as needed for pain.   20 tablet    0   . aspirin EC 81 MG tablet   Oral   Take 81 mg by mouth daily.         Marland Kitchen. azithromycin (ZITHROMAX) 250 MG tablet   Oral   Take 1 tablet (250 mg total) by mouth daily. Take first 2 tablets together, then 1 every day until finished.   6 tablet   0   . benazepril (LOTENSIN) 40 MG tablet   Oral   Take 40 mg by mouth daily.         Marland Kitchen. buPROPion (WELLBUTRIN SR) 150 MG 12 hr tablet   Oral   Take 300 mg by mouth daily.         . cephALEXin (KEFLEX) 500 MG capsule   Oral   Take 1 capsule (500 mg total) by mouth 3 (three) times daily.   30 capsule   0   . citalopram (CELEXA) 20 MG tablet   Oral   Take 20 mg by mouth daily.         . diclofenac (VOLTAREN) 75 MG EC tablet   Oral   Take 1 tablet (75 mg total) by mouth 2 (two) times daily.   20 tablet   0   . fluconazole (DIFLUCAN) 150 MG tablet   Oral   Take 150 mg by mouth See admin instructions. Every 7 days as  needed for yeast infection         . hydrochlorothiazide (HYDRODIURIL) 25 MG tablet   Oral   Take 25 mg by mouth daily.         . hydrOXYzine (VISTARIL) 25 MG capsule   Oral   Take 50 mg by mouth at bedtime.         Marland Kitchen ibuprofen (ADVIL,MOTRIN) 200 MG tablet   Oral   Take 200 mg by mouth every 6 (six) hours as needed. For pain         . insulin regular human CONCENTRATED (HUMULIN R) 500 UNIT/ML SOLN injection   Subcutaneous   Inject 65-125 Units into the skin 3 (three) times daily with meals. 129 units at breakfast, 65 units at lunch, and 105 units at dinner         . loratadine (CLARITIN) 10 MG tablet   Oral   Take 10 mg by mouth daily.         . montelukast (SINGULAIR) 10 MG tablet   Oral   Take 10 mg by mouth daily.         . norgestimate-ethinyl estradiol (ORTHO-CYCLEN,SPRINTEC,PREVIFEM) 0.25-35 MG-MCG tablet   Oral   Take 1 tablet by mouth daily.         . pantoprazole (PROTONIX) 40 MG tablet   Oral   Take 40 mg by mouth daily.         . pravastatin (PRAVACHOL) 40 MG  tablet   Oral   Take 40 mg by mouth daily.         . sitaGLIPtin (JANUVIA) 100 MG tablet   Oral   Take 100 mg by mouth daily.         Marland Kitchen sulfamethoxazole-trimethoprim (BACTRIM DS) 800-160 MG per tablet   Oral   Take 2 tablets by mouth 2 (two) times daily.   40 tablet   0   . traZODone (DESYREL) 50 MG tablet   Oral   Take 50 mg by mouth at bedtime as needed. For sleep          BP 111/65  Pulse 113  Temp(Src) 99.6 F (37.6 C) (Oral)  Resp 16  SpO2 99%  LMP 01/08/2014 Physical Exam  Nursing note and vitals reviewed. Constitutional: She is oriented to person, place, and time. She appears well-developed and well-nourished.  HENT:  Head: Normocephalic and atraumatic.  Right Ear: Hearing, tympanic membrane, external ear and ear canal normal.  Left Ear: Hearing, tympanic membrane, external ear and ear canal normal.  Nose: Nose normal.  Mouth/Throat: Uvula is midline, oropharynx is clear and moist and mucous membranes are normal.  Eyes: Conjunctivae are normal. Right eye exhibits no discharge. Left eye exhibits no discharge. No scleral icterus.  Neck: Normal range of motion. Neck supple.  Cardiovascular: Regular rhythm and normal heart sounds.   +mild tachycardia  Pulmonary/Chest: Effort normal and breath sounds normal. No respiratory distress. She has no wheezes.  Abdominal: Soft. Bowel sounds are normal. She exhibits no distension. There is no tenderness.  Musculoskeletal: Normal range of motion.  Lymphadenopathy:    She has no cervical adenopathy.  Neurological: She is alert and oriented to person, place, and time.  Skin: Skin is warm and dry. No rash noted.  Psychiatric: She has a normal mood and affect. Her behavior is normal.    ED Course  Procedures (including critical care time) Labs Review Labs Reviewed - No data to display Imaging Review Dg Chest 2 View  01/16/2014  CLINICAL DATA:  Cough and fever  EXAM: CHEST  2 VIEW  COMPARISON:  August 17, 2012   FINDINGS: There is patchy infiltrate in the left upper lobe. Lungs are otherwise clear. Heart size and pulmonary vascularity are normal. No adenopathy. No bone lesions.  IMPRESSION: Left upper lobe infiltrate.   Electronically Signed   By: Bretta Bang M.D.   On: 01/16/2014 09:35     MDM   1. CAP (community acquired pneumonia)    LUL CAP: will initiate treatment with azithromycin and advise follow up chest xray in 2 weeks.     Jess Barters [place], [place] 01/16/14 [phone]

## 2014-01-16 NOTE — ED Notes (Signed)
C/o cold sx since Friday States she was stuck in the rain on Thursday  States she has congestion, post nasal drip, coughing with a little production, body ache and a fever otc medications taken States she has not been able to sleep due to coughing

## 2014-01-16 NOTE — Discharge Instructions (Signed)
You have infection in the left upper lobe of your lung. Please take medication as directed and follow up in 2 weeks for repeat chest xray.

## 2014-01-16 NOTE — ED Provider Notes (Signed)
Medical screening examination/treatment/procedure(s) were performed by non-physician practitioner and as supervising physician I was immediately available for consultation/collaboration.  Leslee Homeavid Jennessy Sandridge, M.D.  Reuben Likesavid C Naria Abbey, MD 01/16/14 [phone]

## 2014-12-14 ENCOUNTER — Ambulatory Visit (INDEPENDENT_AMBULATORY_CARE_PROVIDER_SITE_OTHER): Payer: 59 | Admitting: Obstetrics and Gynecology

## 2014-12-14 ENCOUNTER — Encounter: Payer: Self-pay | Admitting: Obstetrics and Gynecology

## 2014-12-14 VITALS — BP 126/93 | HR 94 | Temp 98.6°F | Ht 61.0 in | Wt 252.2 lb

## 2014-12-14 DIAGNOSIS — Z1151 Encounter for screening for human papillomavirus (HPV): Secondary | ICD-10-CM

## 2014-12-14 DIAGNOSIS — Z124 Encounter for screening for malignant neoplasm of cervix: Secondary | ICD-10-CM

## 2014-12-14 DIAGNOSIS — Z01419 Encounter for gynecological examination (general) (routine) without abnormal findings: Secondary | ICD-10-CM

## 2014-12-14 LAB — HEPATITIS C ANTIBODY: HCV AB: NEGATIVE

## 2014-12-14 LAB — RPR

## 2014-12-14 LAB — HIV ANTIBODY (ROUTINE TESTING W REFLEX): HIV: NONREACTIVE

## 2014-12-14 LAB — HEPATITIS B SURFACE ANTIGEN: Hepatitis B Surface Ag: NEGATIVE

## 2014-12-14 MED ORDER — NORGESTIMATE-ETH ESTRADIOL 0.25-35 MG-MCG PO TABS: 1.0000 | ORAL_TABLET | Freq: Every day | ORAL | Status: DC

## 2014-12-14 NOTE — Progress Notes (Signed)
  Subjective:     Rita Hogan is a 29 y.o. female G0P0 with LMP 12/05/2014 and BMI 47 who is here for a comprehensive physical exam. The patient reports no problems. She has been sexually active in one partner without contraception. Patient is without complaints.    Past Medical History  Diagnosis Date  . Diabetes mellitus   . Hypertension   . Asthma    Past Surgical History  Procedure Laterality Date  . Eye surgery    . Prepatellar bursa excision      right   Family History  Problem Relation Age of Onset  . Adopted: Yes    History   Social History  . Marital Status: Single    Spouse Name: N/A    Number of Children: N/A  . Years of Education: N/A   Occupational History  . Not on file.   Social History Main Topics  . Smoking status: Former Smoker    Quit date: 07/13/2012  . Smokeless tobacco: Not on file  . Alcohol Use: Yes     Comment: rare  . Drug Use: No  . Sexual Activity: No   Other Topics Concern  . Not on file   Social History Narrative   Health Maintenance  Topic Date Due  . HEMOGLOBIN A1C  01/21/87  . PNEUMOCOCCAL POLYSACCHARIDE VACCINE (1) 02/05/1989  . FOOT EXAM  02/05/1997  . OPHTHALMOLOGY EXAM  02/05/1997  . TETANUS/TDAP  02/05/2006  . URINE MICROALBUMIN  08/02/2010  . INFLUENZA VACCINE  06/09/2014  . PAP SMEAR  04/14/2015       Review of Systems A comprehensive review of systems was negative.   Objective:      GENERAL: Well-developed, well-nourished female in no acute distress. Obeses HEENT: Normocephalic, atraumatic. Sclerae anicteric.  NECK: Supple. Normal thyroid.  LUNGS: Clear to auscultation bilaterally.  HEART: Regular rate and rhythm. BREASTS: Symmetric in size. No palpable masses or lymphadenopathy, skin changes, or nipple drainage. ABDOMEN: Soft, nontender, nondistended. No organomegaly. Obese PELVIC: Normal external female genitalia. Vagina is pink and rugated.  Normal discharge. Normal appearing cervix. No adnexal  mass or tenderness. Bimanual exam is limited by body habitus EXTREMITIES: No cyanosis, clubbing, or edema, 2+ distal pulses.    Assessment:    Healthy female exam.      Plan:    Pap smear collected today Patient will be contacted with any abnormal results Patient desires full STD testing See After Visit Summary for Counseling Recommendations

## 2014-12-14 NOTE — Patient Instructions (Signed)
Preventive Care for Adults A healthy lifestyle and preventive care can promote health and wellness. Preventive health guidelines for women include the following key practices.  A routine yearly physical is a good way to check with your health care provider about your health and preventive screening. It is a chance to share any concerns and updates on your health and to receive a thorough exam.  Visit your dentist for a routine exam and preventive care every 6 months. Brush your teeth twice a day and floss once a day. Good oral hygiene prevents tooth decay and gum disease.  The frequency of eye exams is based on your age, health, family medical history, use of contact lenses, and other factors. Follow your health care provider's recommendations for frequency of eye exams.  Eat a healthy diet. Foods like vegetables, fruits, whole grains, low-fat dairy products, and lean protein foods contain the nutrients you need without too many calories. Decrease your intake of foods high in solid fats, added sugars, and salt. Eat the right amount of calories for you.Get information about a proper diet from your health care provider, if necessary.  Regular physical exercise is one of the most important things you can do for your health. Most adults should get at least 150 minutes of moderate-intensity exercise (any activity that increases your heart rate and causes you to sweat) each week. In addition, most adults need muscle-strengthening exercises on 2 or more days a week.  Maintain a healthy weight. The body mass index (BMI) is a screening tool to identify possible weight problems. It provides an estimate of body fat based on height and weight. Your health care provider can find your BMI and can help you achieve or maintain a healthy weight.For adults 20 years and older:  A BMI below 18.5 is considered underweight.  A BMI of 18.5 to 24.9 is normal.  A BMI of 25 to 29.9 is considered overweight.  A BMI of  30 and above is considered obese.  Maintain normal blood lipids and cholesterol levels by exercising and minimizing your intake of saturated fat. Eat a balanced diet with plenty of fruit and vegetables. Blood tests for lipids and cholesterol should begin at age 20 and be repeated every 5 years. If your lipid or cholesterol levels are high, you are over 50, or you are at high risk for heart disease, you may need your cholesterol levels checked more frequently.Ongoing high lipid and cholesterol levels should be treated with medicines if diet and exercise are not working.  If you smoke, find out from your health care provider how to quit. If you do not use tobacco, do not start.  Lung cancer screening is recommended for adults aged 55-80 years who are at high risk for developing lung cancer because of a history of smoking. A yearly low-dose CT scan of the lungs is recommended for people who have at least a 30-pack-year history of smoking and are a current smoker or have quit within the past 15 years. A pack year of smoking is smoking an average of 1 pack of cigarettes a day for 1 year (for example: 1 pack a day for 30 years or 2 packs a day for 15 years). Yearly screening should continue until the smoker has stopped smoking for at least 15 years. Yearly screening should be stopped for people who develop a health problem that would prevent them from having lung cancer treatment.  If you are pregnant, do not drink alcohol. If you are breastfeeding,   be very cautious about drinking alcohol. If you are not pregnant and choose to drink alcohol, do not have more than 1 drink per day. One drink is considered to be 12 ounces (355 mL) of beer, 5 ounces (148 mL) of wine, or 1.5 ounces (44 mL) of liquor.  Avoid use of street drugs. Do not share needles with anyone. Ask for help if you need support or instructions about stopping the use of drugs.  High blood pressure causes heart disease and increases the risk of  stroke. Your blood pressure should be checked at least every 1 to 2 years. Ongoing high blood pressure should be treated with medicines if weight loss and exercise do not work.  If you are 75-52 years old, ask your health care provider if you should take aspirin to prevent strokes.  Diabetes screening involves taking a blood sample to check your fasting blood sugar level. This should be done once every 3 years, after age 15, if you are within normal weight and without risk factors for diabetes. Testing should be considered at a younger age or be carried out more frequently if you are overweight and have at least 1 risk factor for diabetes.  Breast cancer screening is essential preventive care for women. You should practice "breast self-awareness." This means understanding the normal appearance and feel of your breasts and may include breast self-examination. Any changes detected, no matter how small, should be reported to a health care provider. Women in their 58s and 30s should have a clinical breast exam (CBE) by a health care provider as part of a regular health exam every 1 to 3 years. After age 16, women should have a CBE every year. Starting at age 53, women should consider having a mammogram (breast X-ray test) every year. Women who have a family history of breast cancer should talk to their health care provider about genetic screening. Women at a high risk of breast cancer should talk to their health care providers about having an MRI and a mammogram every year.  Breast cancer gene (BRCA)-related cancer risk assessment is recommended for women who have family members with BRCA-related cancers. BRCA-related cancers include breast, ovarian, tubal, and peritoneal cancers. Having family members with these cancers may be associated with an increased risk for harmful changes (mutations) in the breast cancer genes BRCA1 and BRCA2. Results of the assessment will determine the need for genetic counseling and  BRCA1 and BRCA2 testing.  Routine pelvic exams to screen for cancer are no longer recommended for nonpregnant women who are considered low risk for cancer of the pelvic organs (ovaries, uterus, and vagina) and who do not have symptoms. Ask your health care provider if a screening pelvic exam is right for you.  If you have had past treatment for cervical cancer or a condition that could lead to cancer, you need Pap tests and screening for cancer for at least 20 years after your treatment. If Pap tests have been discontinued, your risk factors (such as having a new sexual partner) need to be reassessed to determine if screening should be resumed. Some women have medical problems that increase the chance of getting cervical cancer. In these cases, your health care provider may recommend more frequent screening and Pap tests.  The HPV test is an additional test that may be used for cervical cancer screening. The HPV test looks for the virus that can cause the cell changes on the cervix. The cells collected during the Pap test can be  tested for HPV. The HPV test could be used to screen women aged 30 years and older, and should be used in women of any age who have unclear Pap test results. After the age of 30, women should have HPV testing at the same frequency as a Pap test.  Colorectal cancer can be detected and often prevented. Most routine colorectal cancer screening begins at the age of 50 years and continues through age 75 years. However, your health care provider may recommend screening at an earlier age if you have risk factors for colon cancer. On a yearly basis, your health care provider may provide home test kits to check for hidden blood in the stool. Use of a small camera at the end of a tube, to directly examine the colon (sigmoidoscopy or colonoscopy), can detect the earliest forms of colorectal cancer. Talk to your health care provider about this at age 50, when routine screening begins. Direct  exam of the colon should be repeated every 5-10 years through age 75 years, unless early forms of pre-cancerous polyps or small growths are found.  People who are at an increased risk for hepatitis B should be screened for this virus. You are considered at high risk for hepatitis B if:  You were born in a country where hepatitis B occurs often. Talk with your health care provider about which countries are considered high risk.  Your parents were born in a high-risk country and you have not received a shot to protect against hepatitis B (hepatitis B vaccine).  You have HIV or AIDS.  You use needles to inject street drugs.  You live with, or have sex with, someone who has hepatitis B.  You get hemodialysis treatment.  You take certain medicines for conditions like cancer, organ transplantation, and autoimmune conditions.  Hepatitis C blood testing is recommended for all people born from 1945 through 1965 and any individual with known risks for hepatitis C.  Practice safe sex. Use condoms and avoid high-risk sexual practices to reduce the spread of sexually transmitted infections (STIs). STIs include gonorrhea, chlamydia, syphilis, trichomonas, herpes, HPV, and human immunodeficiency virus (HIV). Herpes, HIV, and HPV are viral illnesses that have no cure. They can result in disability, cancer, and death.  You should be screened for sexually transmitted illnesses (STIs) including gonorrhea and chlamydia if:  You are sexually active and are younger than 24 years.  You are older than 24 years and your health care provider tells you that you are at risk for this type of infection.  Your sexual activity has changed since you were last screened and you are at an increased risk for chlamydia or gonorrhea. Ask your health care provider if you are at risk.  If you are at risk of being infected with HIV, it is recommended that you take a prescription medicine daily to prevent HIV infection. This is  called preexposure prophylaxis (PrEP). You are considered at risk if:  You are a heterosexual woman, are sexually active, and are at increased risk for HIV infection.  You take drugs by injection.  You are sexually active with a partner who has HIV.  Talk with your health care provider about whether you are at high risk of being infected with HIV. If you choose to begin PrEP, you should first be tested for HIV. You should then be tested every 3 months for as long as you are taking PrEP.  Osteoporosis is a disease in which the bones lose minerals and strength   with aging. This can result in serious bone fractures or breaks. The risk of osteoporosis can be identified using a bone density scan. Women ages 65 years and over and women at risk for fractures or osteoporosis should discuss screening with their health care providers. Ask your health care provider whether you should take a calcium supplement or vitamin D to reduce the rate of osteoporosis.  Menopause can be associated with physical symptoms and risks. Hormone replacement therapy is available to decrease symptoms and risks. You should talk to your health care provider about whether hormone replacement therapy is right for you.  Use sunscreen. Apply sunscreen liberally and repeatedly throughout the day. You should seek shade when your shadow is shorter than you. Protect yourself by wearing long sleeves, pants, a wide-brimmed hat, and sunglasses year round, whenever you are outdoors.  Once a month, do a whole body skin exam, using a mirror to look at the skin on your back. Tell your health care provider of new moles, moles that have irregular borders, moles that are larger than a pencil eraser, or moles that have changed in shape or color.  Stay current with required vaccines (immunizations).  Influenza vaccine. All adults should be immunized every year.  Tetanus, diphtheria, and acellular pertussis (Td, Tdap) vaccine. Pregnant women should  receive 1 dose of Tdap vaccine during each pregnancy. The dose should be obtained regardless of the length of time since the last dose. Immunization is preferred during the 27th-36th week of gestation. An adult who has not previously received Tdap or who does not know her vaccine status should receive 1 dose of Tdap. This initial dose should be followed by tetanus and diphtheria toxoids (Td) booster doses every 10 years. Adults with an unknown or incomplete history of completing a 3-dose immunization series with Td-containing vaccines should begin or complete a primary immunization series including a Tdap dose. Adults should receive a Td booster every 10 years.  Varicella vaccine. An adult without evidence of immunity to varicella should receive 2 doses or a second dose if she has previously received 1 dose. Pregnant females who do not have evidence of immunity should receive the first dose after pregnancy. This first dose should be obtained before leaving the health care facility. The second dose should be obtained 4-8 weeks after the first dose.  Human papillomavirus (HPV) vaccine. Females aged 13-26 years who have not received the vaccine previously should obtain the 3-dose series. The vaccine is not recommended for use in pregnant females. However, pregnancy testing is not needed before receiving a dose. If a female is found to be pregnant after receiving a dose, no treatment is needed. In that case, the remaining doses should be delayed until after the pregnancy. Immunization is recommended for any person with an immunocompromised condition through the age of 26 years if she did not get any or all doses earlier. During the 3-dose series, the second dose should be obtained 4-8 weeks after the first dose. The third dose should be obtained 24 weeks after the first dose and 16 weeks after the second dose.  Zoster vaccine. One dose is recommended for adults aged 60 years or older unless certain conditions are  present.  Measles, mumps, and rubella (MMR) vaccine. Adults born before 1957 generally are considered immune to measles and mumps. Adults born in 1957 or later should have 1 or more doses of MMR vaccine unless there is a contraindication to the vaccine or there is laboratory evidence of immunity to   each of the three diseases. A routine second dose of MMR vaccine should be obtained at least 28 days after the first dose for students attending postsecondary schools, health care workers, or international travelers. People who received inactivated measles vaccine or an unknown type of measles vaccine during 1963-1967 should receive 2 doses of MMR vaccine. People who received inactivated mumps vaccine or an unknown type of mumps vaccine before 1979 and are at high risk for mumps infection should consider immunization with 2 doses of MMR vaccine. For females of childbearing age, rubella immunity should be determined. If there is no evidence of immunity, females who are not pregnant should be vaccinated. If there is no evidence of immunity, females who are pregnant should delay immunization until after pregnancy. Unvaccinated health care workers born before 1957 who lack laboratory evidence of measles, mumps, or rubella immunity or laboratory confirmation of disease should consider measles and mumps immunization with 2 doses of MMR vaccine or rubella immunization with 1 dose of MMR vaccine.  Pneumococcal 13-valent conjugate (PCV13) vaccine. When indicated, a person who is uncertain of her immunization history and has no record of immunization should receive the PCV13 vaccine. An adult aged 19 years or older who has certain medical conditions and has not been previously immunized should receive 1 dose of PCV13 vaccine. This PCV13 should be followed with a dose of pneumococcal polysaccharide (PPSV23) vaccine. The PPSV23 vaccine dose should be obtained at least 8 weeks after the dose of PCV13 vaccine. An adult aged 19  years or older who has certain medical conditions and previously received 1 or more doses of PPSV23 vaccine should receive 1 dose of PCV13. The PCV13 vaccine dose should be obtained 1 or more years after the last PPSV23 vaccine dose.  Pneumococcal polysaccharide (PPSV23) vaccine. When PCV13 is also indicated, PCV13 should be obtained first. All adults aged 65 years and older should be immunized. An adult younger than age 65 years who has certain medical conditions should be immunized. Any person who resides in a nursing home or long-term care facility should be immunized. An adult smoker should be immunized. People with an immunocompromised condition and certain other conditions should receive both PCV13 and PPSV23 vaccines. People with human immunodeficiency virus (HIV) infection should be immunized as soon as possible after diagnosis. Immunization during chemotherapy or radiation therapy should be avoided. Routine use of PPSV23 vaccine is not recommended for American Indians, [place] Natives, or people younger than 65 years unless there are medical conditions that require PPSV23 vaccine. When indicated, people who have unknown immunization and have no record of immunization should receive PPSV23 vaccine. One-time revaccination 5 years after the first dose of PPSV23 is recommended for people aged 19-64 years who have chronic kidney failure, nephrotic syndrome, asplenia, or immunocompromised conditions. People who received 1-2 doses of PPSV23 before age 65 years should receive another dose of PPSV23 vaccine at age 65 years or later if at least 5 years have passed since the previous dose. Doses of PPSV23 are not needed for people immunized with PPSV23 at or after age 65 years.  Meningococcal vaccine. Adults with asplenia or persistent complement component deficiencies should receive 2 doses of quadrivalent meningococcal conjugate (MenACWY-D) vaccine. The doses should be obtained at least 2 months apart.  Microbiologists working with certain meningococcal bacteria, military recruits, people at risk during an outbreak, and people who travel to or live in countries with a high rate of meningitis should be immunized. A first-year college student up through age   21 years who is living in a residence hall should receive a dose if she did not receive a dose on or after her 16th birthday. Adults who have certain high-risk conditions should receive one or more doses of vaccine.  Hepatitis A vaccine. Adults who wish to be protected from this disease, have certain high-risk conditions, work with hepatitis A-infected animals, work in hepatitis A research labs, or travel to or work in countries with a high rate of hepatitis A should be immunized. Adults who were previously unvaccinated and who anticipate close contact with an international adoptee during the first 60 days after arrival in the [place] from a country with a high rate of hepatitis A should be immunized.  Hepatitis B vaccine. Adults who wish to be protected from this disease, have certain high-risk conditions, may be exposed to blood or other infectious body fluids, are household contacts or sex partners of hepatitis B positive people, are clients or workers in certain care facilities, or travel to or work in countries with a high rate of hepatitis B should be immunized.  Haemophilus influenzae type b (Hib) vaccine. A previously unvaccinated person with asplenia or sickle cell disease or having a scheduled splenectomy should receive 1 dose of Hib vaccine. Regardless of previous immunization, a recipient of a hematopoietic stem cell transplant should receive a 3-dose series 6-12 months after her successful transplant. Hib vaccine is not recommended for adults with HIV infection. Preventive Services / Frequency Ages 64 to 68 years  Blood pressure check.** / Every 1 to 2 years.  Lipid and cholesterol check.** / Every 5 years beginning at age  22.  Clinical breast exam.** / Every 3 years for women in their 88s and 53s.  BRCA-related cancer risk assessment.** / For women who have family members with a BRCA-related cancer (breast, ovarian, tubal, or peritoneal cancers).  Pap test.** / Every 2 years from ages 90 through 51. Every 3 years starting at age 21 through age 56 or 3 with a history of 3 consecutive normal Pap tests.  HPV screening.** / Every 3 years from ages 24 through ages 1 to 46 with a history of 3 consecutive normal Pap tests.  Hepatitis C blood test.** / For any individual with known risks for hepatitis C.  Skin self-exam. / Monthly.  Influenza vaccine. / Every year.  Tetanus, diphtheria, and acellular pertussis (Tdap, Td) vaccine.** / Consult your health care provider. Pregnant women should receive 1 dose of Tdap vaccine during each pregnancy. 1 dose of Td every 10 years.  Varicella vaccine.** / Consult your health care provider. Pregnant females who do not have evidence of immunity should receive the first dose after pregnancy.  HPV vaccine. / 3 doses over 6 months, if 72 and younger. The vaccine is not recommended for use in pregnant females. However, pregnancy testing is not needed before receiving a dose.  Measles, mumps, rubella (MMR) vaccine.** / You need at least 1 dose of MMR if you were born in 1957 or later. You may also need a 2nd dose. For females of childbearing age, rubella immunity should be determined. If there is no evidence of immunity, females who are not pregnant should be vaccinated. If there is no evidence of immunity, females who are pregnant should delay immunization until after pregnancy.  Pneumococcal 13-valent conjugate (PCV13) vaccine.** / Consult your health care provider.  Pneumococcal polysaccharide (PPSV23) vaccine.** / 1 to 2 doses if you smoke cigarettes or if you have certain conditions.  Meningococcal vaccine.** /  1 dose if you are age 19 to 21 years and a first-year college  student living in a residence hall, or have one of several medical conditions, you need to get vaccinated against meningococcal disease. You may also need additional booster doses.  Hepatitis A vaccine.** / Consult your health care provider.  Hepatitis B vaccine.** / Consult your health care provider.  Haemophilus influenzae type b (Hib) vaccine.** / Consult your health care provider. Ages 40 to 64 years  Blood pressure check.** / Every 1 to 2 years.  Lipid and cholesterol check.** / Every 5 years beginning at age 20 years.  Lung cancer screening. / Every year if you are aged 55-80 years and have a 30-pack-year history of smoking and currently smoke or have quit within the past 15 years. Yearly screening is stopped once you have quit smoking for at least 15 years or develop a health problem that would prevent you from having lung cancer treatment.  Clinical breast exam.** / Every year after age 40 years.  BRCA-related cancer risk assessment.** / For women who have family members with a BRCA-related cancer (breast, ovarian, tubal, or peritoneal cancers).  Mammogram.** / Every year beginning at age 40 years and continuing for as long as you are in good health. Consult with your health care provider.  Pap test.** / Every 3 years starting at age 30 years through age 65 or 70 years with a history of 3 consecutive normal Pap tests.  HPV screening.** / Every 3 years from ages 30 years through ages 65 to 70 years with a history of 3 consecutive normal Pap tests.  Fecal occult blood test (FOBT) of stool. / Every year beginning at age 50 years and continuing until age 75 years. You may not need to do this test if you get a colonoscopy every 10 years.  Flexible sigmoidoscopy or colonoscopy.** / Every 5 years for a flexible sigmoidoscopy or every 10 years for a colonoscopy beginning at age 50 years and continuing until age 75 years.  Hepatitis C blood test.** / For all people born from 1945 through  1965 and any individual with known risks for hepatitis C.  Skin self-exam. / Monthly.  Influenza vaccine. / Every year.  Tetanus, diphtheria, and acellular pertussis (Tdap/Td) vaccine.** / Consult your health care provider. Pregnant women should receive 1 dose of Tdap vaccine during each pregnancy. 1 dose of Td every 10 years.  Varicella vaccine.** / Consult your health care provider. Pregnant females who do not have evidence of immunity should receive the first dose after pregnancy.  Zoster vaccine.** / 1 dose for adults aged 60 years or older.  Measles, mumps, rubella (MMR) vaccine.** / You need at least 1 dose of MMR if you were born in 1957 or later. You may also need a 2nd dose. For females of childbearing age, rubella immunity should be determined. If there is no evidence of immunity, females who are not pregnant should be vaccinated. If there is no evidence of immunity, females who are pregnant should delay immunization until after pregnancy.  Pneumococcal 13-valent conjugate (PCV13) vaccine.** / Consult your health care provider.  Pneumococcal polysaccharide (PPSV23) vaccine.** / 1 to 2 doses if you smoke cigarettes or if you have certain conditions.  Meningococcal vaccine.** / Consult your health care provider.  Hepatitis A vaccine.** / Consult your health care provider.  Hepatitis B vaccine.** / Consult your health care provider.  Haemophilus influenzae type b (Hib) vaccine.** / Consult your health care provider. Ages 65   years and over  Blood pressure check.** / Every 1 to 2 years.  Lipid and cholesterol check.** / Every 5 years beginning at age 22 years.  Lung cancer screening. / Every year if you are aged 73-80 years and have a 30-pack-year history of smoking and currently smoke or have quit within the past 15 years. Yearly screening is stopped once you have quit smoking for at least 15 years or develop a health problem that would prevent you from having lung cancer  treatment.  Clinical breast exam.** / Every year after age 4 years.  BRCA-related cancer risk assessment.** / For women who have family members with a BRCA-related cancer (breast, ovarian, tubal, or peritoneal cancers).  Mammogram.** / Every year beginning at age 40 years and continuing for as long as you are in good health. Consult with your health care provider.  Pap test.** / Every 3 years starting at age 9 years through age 34 or 91 years with 3 consecutive normal Pap tests. Testing can be stopped between 65 and 70 years with 3 consecutive normal Pap tests and no abnormal Pap or HPV tests in the past 10 years.  HPV screening.** / Every 3 years from ages 57 years through ages 64 or 45 years with a history of 3 consecutive normal Pap tests. Testing can be stopped between 65 and 70 years with 3 consecutive normal Pap tests and no abnormal Pap or HPV tests in the past 10 years.  Fecal occult blood test (FOBT) of stool. / Every year beginning at age 15 years and continuing until age 17 years. You may not need to do this test if you get a colonoscopy every 10 years.  Flexible sigmoidoscopy or colonoscopy.** / Every 5 years for a flexible sigmoidoscopy or every 10 years for a colonoscopy beginning at age 86 years and continuing until age 71 years.  Hepatitis C blood test.** / For all people born from [birth year] through 1964 and any individual with known risks for hepatitis C.  Osteoporosis screening.** / A one-time screening for women ages 83 years and over and women at risk for fractures or osteoporosis.  Skin self-exam. / Monthly.  Influenza vaccine. / Every year.  Tetanus, diphtheria, and acellular pertussis (Tdap/Td) vaccine.** / 1 dose of Td every 10 years.  Varicella vaccine.** / Consult your health care provider.  Zoster vaccine.** / 1 dose for adults aged 61 years or older.  Pneumococcal 13-valent conjugate (PCV13) vaccine.** / Consult your health care provider.  Pneumococcal  polysaccharide (PPSV23) vaccine.** / 1 dose for all adults aged 28 years and older.  Meningococcal vaccine.** / Consult your health care provider.  Hepatitis A vaccine.** / Consult your health care provider.  Hepatitis B vaccine.** / Consult your health care provider.  Haemophilus influenzae type b (Hib) vaccine.** / Consult your health care provider. ** Family history and personal history of risk and conditions may change your health care provider's recommendations. Document Released: 12/22/2001 Document Revised: 03/12/2014 Document Reviewed: 03/23/2011 [facility] Patient Information 2015 [place], [place]. This information is not intended to replace advice given to you by your health care provider. Make sure you discuss any questions you have with your health care provider.

## 2014-12-18 LAB — CYTOLOGY - PAP

## 2015-04-27 ENCOUNTER — Encounter (HOSPITAL_COMMUNITY): Payer: Self-pay | Admitting: *Deleted

## 2015-04-27 ENCOUNTER — Emergency Department (HOSPITAL_COMMUNITY)
Admission: EM | Admit: 2015-04-27 | Discharge: 2015-04-27 | Disposition: A | Discharge status: Home / Self Care | Payer: 59 | Attending: Emergency Medicine | Admitting: Emergency Medicine

## 2015-04-27 DIAGNOSIS — Z87891 Personal history of nicotine dependence: Secondary | ICD-10-CM | POA: Diagnosis not present

## 2015-04-27 DIAGNOSIS — I10 Essential (primary) hypertension: Secondary | ICD-10-CM | POA: Diagnosis not present

## 2015-04-27 DIAGNOSIS — R197 Diarrhea, unspecified: Secondary | ICD-10-CM | POA: Diagnosis not present

## 2015-04-27 DIAGNOSIS — Z3202 Encounter for pregnancy test, result negative: Secondary | ICD-10-CM | POA: Insufficient documentation

## 2015-04-27 DIAGNOSIS — Z79899 Other long term (current) drug therapy: Secondary | ICD-10-CM | POA: Insufficient documentation

## 2015-04-27 DIAGNOSIS — E669 Obesity, unspecified: Secondary | ICD-10-CM | POA: Insufficient documentation

## 2015-04-27 DIAGNOSIS — R109 Unspecified abdominal pain: Secondary | ICD-10-CM | POA: Insufficient documentation

## 2015-04-27 DIAGNOSIS — R630 Anorexia: Secondary | ICD-10-CM | POA: Diagnosis not present

## 2015-04-27 DIAGNOSIS — E119 Type 2 diabetes mellitus without complications: Secondary | ICD-10-CM | POA: Diagnosis not present

## 2015-04-27 DIAGNOSIS — Z794 Long term (current) use of insulin: Secondary | ICD-10-CM | POA: Insufficient documentation

## 2015-04-27 DIAGNOSIS — Z7982 Long term (current) use of aspirin: Secondary | ICD-10-CM | POA: Insufficient documentation

## 2015-04-27 DIAGNOSIS — R110 Nausea: Secondary | ICD-10-CM | POA: Insufficient documentation

## 2015-04-27 DIAGNOSIS — J45909 Unspecified asthma, uncomplicated: Secondary | ICD-10-CM | POA: Insufficient documentation

## 2015-04-27 LAB — URINE MICROSCOPIC-ADD ON

## 2015-04-27 LAB — CBC WITH DIFFERENTIAL/PLATELET
Basophils Absolute: 0 10*3/uL (ref 0.0–0.1)
Basophils Relative: 0 % (ref 0–1)
Eosinophils Absolute: 0.1 10*3/uL (ref 0.0–0.7)
Eosinophils Relative: 1 % (ref 0–5)
HCT: 42.9 % (ref 36.0–46.0)
HEMOGLOBIN: 15.4 g/dL — AB (ref 12.0–15.0)
Lymphocytes Relative: 28 % (ref 12–46)
Lymphs Abs: 2 10*3/uL (ref 0.7–4.0)
MCH: 30.3 pg (ref 26.0–34.0)
MCHC: 35.9 g/dL (ref 30.0–36.0)
MCV: 84.3 fL (ref 78.0–100.0)
MONOS PCT: 8 % (ref 3–12)
Monocytes Absolute: 0.6 10*3/uL (ref 0.1–1.0)
NEUTROS ABS: 4.5 10*3/uL (ref 1.7–7.7)
NEUTROS PCT: 63 % (ref 43–77)
Platelets: 319 10*3/uL (ref 150–400)
RBC: 5.09 MIL/uL (ref 3.87–5.11)
RDW: 11.9 % (ref 11.5–15.5)
WBC: 7.2 10*3/uL (ref 4.0–10.5)

## 2015-04-27 LAB — URINALYSIS, ROUTINE W REFLEX MICROSCOPIC
GLUCOSE, UA: 500 mg/dL — AB
Hgb urine dipstick: NEGATIVE
LEUKOCYTES UA: NEGATIVE
NITRITE: NEGATIVE
PROTEIN: 30 mg/dL — AB
Specific Gravity, Urine: >1.03 — ABNORMAL HIGH (ref 1.005–1.030)
Urobilinogen, UA: 0.2 mg/dL (ref 0.0–1.0)
pH: 5.5 (ref 5.0–8.0)

## 2015-04-27 LAB — COMPREHENSIVE METABOLIC PANEL
ALBUMIN: 3.4 g/dL — AB (ref 3.5–5.0)
ALK PHOS: 73 U/L (ref 38–126)
ALT: 22 U/L (ref 14–54)
ANION GAP: 13 (ref 5–15)
AST: 30 U/L (ref 15–41)
BILIRUBIN TOTAL: 1.1 mg/dL (ref 0.3–1.2)
BUN: 9 mg/dL (ref 6–20)
CHLORIDE: 104 mmol/L (ref 101–111)
CO2: 18 mmol/L — AB (ref 22–32)
Calcium: 8.9 mg/dL (ref 8.9–10.3)
Creatinine, Ser: 0.53 mg/dL (ref 0.44–1.00)
GFR calc Af Amer: >60 mL/min (ref >60)
GLUCOSE: 216 mg/dL — AB (ref 65–99)
Potassium: 4.2 mmol/L (ref 3.5–5.1)
Sodium: 135 mmol/L (ref 135–145)
Total Protein: 7 g/dL (ref 6.5–8.1)

## 2015-04-27 LAB — PREGNANCY, URINE: Preg Test, Ur: NEGATIVE

## 2015-04-27 LAB — LIPASE, BLOOD: Lipase: 21 U/L — ABNORMAL LOW (ref 22–51)

## 2015-04-27 MED ORDER — DICYCLOMINE HCL 20 MG PO TABS: 20.0000 mg | ORAL_TABLET | Freq: Four times a day (QID) | ORAL | Status: DC | PRN

## 2015-04-27 MED ORDER — FENTANYL CITRATE (PF) 100 MCG/2ML IJ SOLN
50.0000 ug | Freq: Once | INTRAMUSCULAR | Status: AC
  Administered 2015-04-27: 50 ug via INTRAVENOUS
  Filled 2015-04-27: qty 2

## 2015-04-27 MED ORDER — DICYCLOMINE HCL 10 MG/ML IM SOLN
20.0000 mg | Freq: Once | INTRAMUSCULAR | Status: AC
  Administered 2015-04-27: 20 mg via INTRAMUSCULAR
  Filled 2015-04-27: qty 2

## 2015-04-27 MED ORDER — SODIUM CHLORIDE 0.9 % IV BOLUS (SEPSIS)
1000.0000 mL | Freq: Once | INTRAVENOUS | Status: AC
  Administered 2015-04-27: 1000 mL via INTRAVENOUS

## 2015-04-27 MED ORDER — ONDANSETRON 8 MG PO TBDP: 8.0000 mg | ORAL_TABLET | Freq: Three times a day (TID) | ORAL | Status: DC | PRN

## 2015-04-27 MED ORDER — ONDANSETRON HCL 4 MG/2ML IJ SOLN
4.0000 mg | Freq: Once | INTRAMUSCULAR | Status: AC
  Administered 2015-04-27: 4 mg via INTRAVENOUS
  Filled 2015-04-27: qty 2

## 2015-04-27 MED ORDER — ONDANSETRON 4 MG PO TBDP
8.0000 mg | ORAL_TABLET | Freq: Once | ORAL | Status: DC
  Filled 2015-04-27: qty 2

## 2015-04-27 NOTE — ED Notes (Signed)
abd pain since Wednesday with nausea and diarrhea.  lmp last month
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
## 2015-04-27 NOTE — ED Notes (Signed)
Pt ambulated to restroom with this RN.   

## 2015-04-27 NOTE — Discharge Instructions (Signed)
Take medications as prescribed.  Add yogurt with active cultures or commercially available probiotic to your diet for the next few days.  Return to the emergency department for worsening condition or new concerning symptoms.    Abdominal Pain Many things can cause abdominal pain. Usually, abdominal pain is not caused by a disease and will improve without treatment. It can often be observed and treated at home. Your health care provider will do a physical exam and possibly order blood tests and X-rays to help determine the seriousness of your pain. However, in many cases, more time must pass before a clear cause of the pain can be found. Before that point, your health care provider may not know if you need more testing or further treatment. HOME CARE INSTRUCTIONS  Monitor your abdominal pain for any changes. The following actions may help to alleviate any discomfort you are experiencing:  Only take over-the-counter or prescription medicines as directed by your health care provider.  Do not take laxatives unless directed to do so by your health care provider.  Try a clear liquid diet (broth, tea, or water) as directed by your health care provider. Slowly move to a bland diet as tolerated. SEEK MEDICAL CARE IF:  You have unexplained abdominal pain.  You have abdominal pain associated with nausea or diarrhea.  You have pain when you urinate or have a bowel movement.  You experience abdominal pain that wakes you in the night.  You have abdominal pain that is worsened or improved by eating food.  You have abdominal pain that is worsened with eating fatty foods.  You have a fever. SEEK IMMEDIATE MEDICAL CARE IF:   Your pain does not go away within 2 hours.  You keep throwing up (vomiting).  Your pain is felt only in portions of the abdomen, such as the right side or the left lower portion of the abdomen.  You pass bloody or black tarry stools. MAKE SURE YOU:  Understand these  instructions.   Will watch your condition.   Will get help right away if you are not doing well or get worse.  Document Released: 08/05/2005 Document Revised: 10/31/2013 Document Reviewed: 07/05/2013 [facility] Patient Information 2015 [place], [place]. This information is not intended to replace advice given to you by your health care provider. Make sure you discuss any questions you have with your health care provider.  Diarrhea Diarrhea is frequent loose and watery bowel movements. It can cause you to feel weak and dehydrated. Dehydration can cause you to become tired and thirsty, have a dry mouth, and have decreased urination that often is dark yellow. Diarrhea is a sign of another problem, most often an infection that will not last long. In most cases, diarrhea typically lasts 2-3 days. However, it can last longer if it is a sign of something more serious. It is important to treat your diarrhea as directed by your caregiver to lessen or prevent future episodes of diarrhea. CAUSES  Some common causes include:  Gastrointestinal infections caused by viruses, bacteria, or parasites.  Food poisoning or food allergies.  Certain medicines, such as antibiotics, chemotherapy, and laxatives.  Artificial sweeteners and fructose.  Digestive disorders. HOME CARE INSTRUCTIONS  Ensure adequate fluid intake (hydration): Have 1 cup (8 oz) of fluid for each diarrhea episode. Avoid fluids that contain simple sugars or sports drinks, fruit juices, whole milk products, and sodas. Your urine should be clear or pale yellow if you are drinking enough fluids. Hydrate with an oral rehydration  solution that you can purchase at pharmacies, retail stores, and online. You can prepare an oral rehydration solution at home by mixing the following ingredients together:   - tsp table salt.   tsp baking soda.   tsp salt substitute containing potassium chloride.  1  tablespoons sugar.  1 L (34 oz) of  water.  Certain foods and beverages may increase the speed at which food moves through the gastrointestinal (GI) tract. These foods and beverages should be avoided and include:  Caffeinated and alcoholic beverages.  High-fiber foods, such as raw fruits and vegetables, nuts, seeds, and whole grain breads and cereals.  Foods and beverages sweetened with sugar alcohols, such as xylitol, sorbitol, and mannitol.  Some foods may be well tolerated and may help thicken stool including:  Starchy foods, such as rice, toast, pasta, low-sugar cereal, oatmeal, grits, baked potatoes, crackers, and bagels.  Bananas.  Applesauce.  Add probiotic-rich foods to help increase healthy bacteria in the GI tract, such as yogurt and fermented milk products.  Wash your hands well after each diarrhea episode.  Only take over-the-counter or prescription medicines as directed by your caregiver.  Take a warm bath to relieve any burning or pain from frequent diarrhea episodes. SEEK IMMEDIATE MEDICAL CARE IF:   You are unable to keep fluids down.  You have persistent vomiting.  You have blood in your stool, or your stools are black and tarry.  You do not urinate in 6-8 hours, or there is only a small amount of very dark urine.  You have abdominal pain that increases or localizes.  You have weakness, dizziness, confusion, or light-headedness.  You have a severe headache.  Your diarrhea gets worse or does not get better.  You have a fever or persistent symptoms for more than 2-3 days.  You have a fever and your symptoms suddenly get worse. MAKE SURE YOU:   Understand these instructions.  Will watch your condition.  Will get help right away if you are not doing well or get worse. Document Released: 10/16/2002 Document Revised: 03/12/2014 Document Reviewed: 07/03/2012 [facility] Patient Information 2015 [place], [place]. This information is not intended to replace advice given to you by your health  care provider. Make sure you discuss any questions you have with your health care provider.  Food Choices to Help Relieve Diarrhea When you have diarrhea, the foods you eat and your eating habits are very important. Choosing the right foods and drinks can help relieve diarrhea. Also, because diarrhea can last up to 7 days, you need to replace lost fluids and electrolytes (such as sodium, potassium, and chloride) in order to help prevent dehydration.  WHAT GENERAL GUIDELINES DO I NEED TO FOLLOW?  Slowly drink 1 cup (8 oz) of fluid for each episode of diarrhea. If you are getting enough fluid, your urine will be clear or pale yellow.  Eat starchy foods. Some good choices include white rice, white toast, pasta, low-fiber cereal, baked potatoes (without the skin), saltine crackers, and bagels.  Avoid large servings of any cooked vegetables.  Limit fruit to two servings per day. A serving is  cup or 1 small piece.  Choose foods with less than 2 g of fiber per serving.  Limit fats to less than 8 tsp (38 g) per day.  Avoid fried foods.  Eat foods that have probiotics in them. Probiotics can be found in certain dairy products.  Avoid foods and beverages that may increase the speed at which food moves through the  stomach and intestines (gastrointestinal tract). Things to avoid include:  High-fiber foods, such as dried fruit, raw fruits and vegetables, nuts, seeds, and whole grain foods.  Spicy foods and high-fat foods.  Foods and beverages sweetened with high-fructose corn syrup, honey, or sugar alcohols such as xylitol, sorbitol, and mannitol. WHAT FOODS ARE RECOMMENDED? Grains White rice. White, [place], or pita breads (fresh or toasted), including plain rolls, buns, or bagels. White pasta. Saltine, soda, or graham crackers. Pretzels. Low-fiber cereal. Cooked cereals made with water (such as cornmeal, farina, or cream cereals). Plain muffins. Matzo. Melba toast. Zwieback.   Vegetables Potatoes (without the skin). Strained tomato and vegetable juices. Most well-cooked and canned vegetables without seeds. Tender lettuce. Fruits Cooked or canned applesauce, apricots, cherries, fruit cocktail, grapefruit, peaches, pears, or plums. Fresh bananas, apples without skin, cherries, grapes, cantaloupe, grapefruit, peaches, oranges, or plums.  Meat and Other Protein Products Baked or boiled chicken. Eggs. Tofu. Fish. Seafood. Smooth peanut butter. Ground or well-cooked tender beef, ham, veal, lamb, pork, or poultry.  Dairy Plain yogurt, kefir, and unsweetened liquid yogurt. Lactose-free milk, buttermilk, or soy milk. Plain hard cheese. Beverages Sport drinks. Clear broths. Diluted fruit juices (except prune). Regular, caffeine-free sodas such as ginger ale. Water. Decaffeinated teas. Oral rehydration solutions. Sugar-free beverages not sweetened with sugar alcohols. Other Bouillon, broth, or soups made from recommended foods.  The items listed above may not be a complete list of recommended foods or beverages. Contact your dietitian for more options. WHAT FOODS ARE NOT RECOMMENDED? Grains Whole grain, whole wheat, bran, or rye breads, rolls, pastas, crackers, and cereals. Wild or brown rice. Cereals that contain more than 2 g of fiber per serving. Corn tortillas or taco shells. Cooked or dry oatmeal. Granola. Popcorn. Vegetables Raw vegetables. Cabbage, broccoli, Brussels sprouts, artichokes, baked beans, beet greens, corn, kale, legumes, peas, sweet potatoes, and yams. Potato skins. Cooked spinach and cabbage. Fruits Dried fruit, including raisins and dates. Raw fruits. Stewed or dried prunes. Fresh apples with skin, apricots, mangoes, pears, raspberries, and strawberries.  Meat and Other Protein Products Chunky peanut butter. Nuts and seeds. Beans and lentils. Tomasa Blase.  Dairy High-fat cheeses. Milk, chocolate milk, and beverages made with milk, such as milk shakes. Cream.  Ice cream. Sweets and Desserts Sweet rolls, doughnuts, and sweet breads. Pancakes and waffles. Fats and Oils Butter. Cream sauces. Margarine. Salad oils. Plain salad dressings. Olives. Avocados.  Beverages Caffeinated beverages (such as coffee, tea, soda, or energy drinks). Alcoholic beverages. Fruit juices with pulp. Prune juice. Soft drinks sweetened with high-fructose corn syrup or sugar alcohols. Other Coconut. Hot sauce. Chili powder. Mayonnaise. Gravy. Cream-based or milk-based soups.  The items listed above may not be a complete list of foods and beverages to avoid. Contact your dietitian for more information. WHAT SHOULD I DO IF I BECOME DEHYDRATED? Diarrhea can sometimes lead to dehydration. Signs of dehydration include dark urine and dry mouth and skin. If you think you are dehydrated, you should rehydrate with an oral rehydration solution. These solutions can be purchased at pharmacies, retail stores, or online.  Drink -1 cup (120-240 mL) of oral rehydration solution each time you have an episode of diarrhea. If drinking this amount makes your diarrhea worse, try drinking smaller amounts more often. For example, drink 1-3 tsp (5-15 mL) every 5-10 minutes.  A general rule for staying hydrated is to drink 1-2 L of fluid per day. Talk to your health care provider about the specific amount you should be drinking each day.  Drink enough fluids to keep your urine clear or pale yellow. Document Released: 01/16/2004 Document Revised: 10/31/2013 Document Reviewed: 09/18/2013 [facility] Patient Information 2015 [place], [place]. This information is not intended to replace advice given to you by your health care provider. Make sure you discuss any questions you have with your health care provider.

## 2015-04-27 NOTE — ED Notes (Signed)
Pt comfortable with discharge and follow up instructions. Pt declines wheelchair, escorted to waiting area by this RN. Prescriptions x2. 

## 2015-04-27 NOTE — ED Notes (Signed)
EDP at bedside  

## 2015-04-27 NOTE — ED Notes (Signed)
Pt ambulated back to restroom with this RN

## 2015-05-01 LAB — GI PATHOGEN PANEL BY PCR, STOOL

## 2015-05-06 ENCOUNTER — Other Ambulatory Visit: Payer: Self-pay

## 2015-12-24 ENCOUNTER — Encounter: Payer: Self-pay | Admitting: Internal Medicine

## 2015-12-24 ENCOUNTER — Ambulatory Visit (INDEPENDENT_AMBULATORY_CARE_PROVIDER_SITE_OTHER): Payer: BLUE CROSS/BLUE SHIELD | Admitting: Internal Medicine

## 2015-12-24 ENCOUNTER — Other Ambulatory Visit (INDEPENDENT_AMBULATORY_CARE_PROVIDER_SITE_OTHER): Payer: BLUE CROSS/BLUE SHIELD

## 2015-12-24 VITALS — BP 130/82 | HR 85 | Temp 98.3°F | Resp 12 | Ht 61.0 in | Wt 257.0 lb

## 2015-12-24 DIAGNOSIS — E1165 Type 2 diabetes mellitus with hyperglycemia: Secondary | ICD-10-CM

## 2015-12-24 DIAGNOSIS — Z23 Encounter for immunization: Secondary | ICD-10-CM

## 2015-12-24 DIAGNOSIS — R7989 Other specified abnormal findings of blood chemistry: Secondary | ICD-10-CM

## 2015-12-24 DIAGNOSIS — L732 Hidradenitis suppurativa: Secondary | ICD-10-CM

## 2015-12-24 DIAGNOSIS — IMO0001 Reserved for inherently not codable concepts without codable children: Secondary | ICD-10-CM

## 2015-12-24 DIAGNOSIS — I10 Essential (primary) hypertension: Secondary | ICD-10-CM

## 2015-12-24 DIAGNOSIS — E119 Type 2 diabetes mellitus without complications: Secondary | ICD-10-CM

## 2015-12-24 DIAGNOSIS — R21 Rash and other nonspecific skin eruption: Secondary | ICD-10-CM

## 2015-12-24 LAB — CBC
HCT: 44.3 % (ref 36.0–46.0)
Hemoglobin: 15.2 g/dL — ABNORMAL HIGH (ref 12.0–15.0)
MCHC: 34.2 g/dL (ref 30.0–36.0)
MCV: 82.7 fl (ref 78.0–100.0)
Platelets: 390 10*3/uL (ref 150.0–400.0)
RBC: 5.36 Mil/uL — ABNORMAL HIGH (ref 3.87–5.11)
RDW: 13 % (ref 11.5–15.5)
WBC: 8.3 10*3/uL (ref 4.0–10.5)

## 2015-12-24 LAB — COMPREHENSIVE METABOLIC PANEL
ALBUMIN: 4.4 g/dL (ref 3.5–5.2)
ALT: 21 U/L (ref 0–35)
AST: 16 U/L (ref 0–37)
Alkaline Phosphatase: 91 U/L (ref 39–117)
BILIRUBIN TOTAL: 0.5 mg/dL (ref 0.2–1.2)
BUN: 13 mg/dL (ref 6–23)
CALCIUM: 9.7 mg/dL (ref 8.4–10.5)
CO2: 27 mEq/L (ref 19–32)
Chloride: 99 mEq/L (ref 96–112)
Creatinine, Ser: 0.52 mg/dL (ref 0.40–1.20)
GFR: 148.3 mL/min (ref >60.00)
Glucose, Bld: 322 mg/dL — ABNORMAL HIGH (ref 70–99)
POTASSIUM: 4.3 meq/L (ref 3.5–5.1)
Sodium: 134 mEq/L — ABNORMAL LOW (ref 135–145)
TOTAL PROTEIN: 7.8 g/dL (ref 6.0–8.3)

## 2015-12-24 LAB — LIPID PANEL
Cholesterol: 298 mg/dL — ABNORMAL HIGH (ref 0–200)
HDL: 52.8 mg/dL (ref >39.00)
NonHDL: 245.04
TRIGLYCERIDES: 220 mg/dL — AB (ref 0.0–149.0)
Total CHOL/HDL Ratio: 6
VLDL: 44 mg/dL — AB (ref 0.0–40.0)

## 2015-12-24 LAB — MICROALBUMIN / CREATININE URINE RATIO
CREATININE, U: 81.4 mg/dL
MICROALB UR: 10.8 mg/dL — AB (ref 0.0–1.9)
MICROALB/CREAT RATIO: 13.3 mg/g (ref 0.0–30.0)

## 2015-12-24 LAB — HEMOGLOBIN A1C: Hgb A1c MFr Bld: 11.8 % — ABNORMAL HIGH (ref 4.6–6.5)

## 2015-12-24 LAB — LDL CHOLESTEROL, DIRECT: Direct LDL: 200 mg/dL

## 2015-12-24 MED ORDER — TRIAMCINOLONE ACETONIDE 0.1 % EX CREA: 1.0000 "application " | TOPICAL_CREAM | Freq: Two times a day (BID) | CUTANEOUS | Status: DC

## 2015-12-24 NOTE — Assessment & Plan Note (Signed)
Weight is going down and she will go back to swimming which is how she has been successful in losing weight in the past. Working on her diet as well.

## 2015-12-24 NOTE — Assessment & Plan Note (Signed)
Not taking anything at this time. Has not been seen or treated by surgeon or dermatology in the past due to insurance coverage. No flare now. Generally in the groin region.

## 2015-12-24 NOTE — Assessment & Plan Note (Signed)
BP at goal on benazepril. Checking CMP and adjust as needed.  

## 2015-12-24 NOTE — Progress Notes (Signed)
   Subjective:    Patient ID: Rita Hogan, female    DOB: 1986-07-06, 30 y.o.   MRN: [id number]  HPI The patient is a new 30 YO female coming in for her diabetes. She has struggled with it since the age of 63. Around that time she was undergoing some depression and started zoloft which caused her to gain a significant amount of weight. She feels her last doctor was not listening to her and not helping her. She has been on insulin in the past but it always makes her gain a significant amount of weight (recent clinical trial for U500 she gained 80 pounds on it for 24 weeks) so does not feel that it helps her sugars. She used to be on invokana and was doing well but taken off it at last visit with her doctor. She denies numbness and got her eyes checked in December without any signs of retinopathy. She is recently working on diet and giving up rice (hard for her cultural identity as she is asian). She will go back to the gym and do swimming which is the exercise she is able to do.   PMH, [facility], social history reviewed and updated.   Review of Systems  Constitutional: Negative for fever, activity change, appetite change, fatigue and unexpected weight change.  HENT: Negative.   Eyes: Negative.   Respiratory: Negative for cough, chest tightness, shortness of breath and wheezing.   Cardiovascular: Negative for chest pain, palpitations and leg swelling.  Gastrointestinal: Negative for nausea, abdominal pain, diarrhea, constipation and abdominal distention.  Musculoskeletal: Negative.   Skin: Negative.   Neurological: Negative.   Psychiatric/Behavioral: Negative.       Objective:   Physical Exam  Constitutional: She is oriented to person, place, and time. She appears well-developed and well-nourished.  Overweight  HENT:  Head: Normocephalic and atraumatic.  Eyes: EOM are normal.  Neck: Normal range of motion.  Cardiovascular: Normal rate and regular rhythm.   Pulmonary/Chest: Effort normal  and breath sounds normal. No respiratory distress. She has no wheezes. She has no rales.  Abdominal: Soft. Bowel sounds are normal. She exhibits no distension. There is no tenderness. There is no rebound.  Musculoskeletal: She exhibits no edema.  Neurological: She is alert and oriented to person, place, and time. Coordination normal.  Skin: Skin is warm and dry.  Several tattoos, some dry skin on one tattoo right leg  Psychiatric: She has a normal mood and affect.   Filed Vitals:   12/24/15 0804  BP: 130/82  Pulse: 85  Temp: 98.3 F (36.8 C)  TempSrc: Oral  Resp: 12  Height:  (1.549 m)  Weight: 257 lb (116.574 kg)  SpO2: 99%      Assessment & Plan:  Flu shot given at visit

## 2015-12-24 NOTE — Assessment & Plan Note (Signed)
Appears to be dry skin, rx for triamcinolone cream and advised to use vaseline on the area after bathing or showering to help moisturize.

## 2015-12-24 NOTE — Patient Instructions (Addendum)
We will check the labs today and then call in the medicines for you.   We will likely add back the invokana and give you the weekly diflucan for the yeast infections.   We have sent in a medicine for the spot on your leg called triamcinolone cream that you can use twice daily for 1-2 weeks. In addition you can use vaseline on the area to help with moisture (the best time to do this is after shower or bathing).   Diabetes and Standards of Medical Care Diabetes is complicated. You may find that your diabetes team includes a dietitian, nurse, diabetes educator, eye doctor, and more. To help everyone know what is going on and to help you get the care you deserve, the following schedule of care was developed to help keep you on track. Below are the tests, exams, vaccines, medicines, education, and plans you will need. HbA1c test This test shows how well you have controlled your glucose over the past 2-3 months. It is used to see if your diabetes management plan needs to be adjusted.   It is performed at least 2 times a year if you are meeting treatment goals.  It is performed 4 times a year if therapy has changed or if you are not meeting treatment goals. Blood pressure test  This test is performed at every routine medical visit. The goal is less than 140/90 mm Hg for most people, but 130/80 mm Hg in some cases. Ask your health care provider about your goal. Dental exam  Follow up with the dentist regularly. Eye exam  If you are diagnosed with type 1 diabetes as a child, get an exam upon reaching the age of 20 years or older and having had diabetes for 3-5 years. Yearly eye exams are recommended after that initial eye exam.  If you are diagnosed with type 1 diabetes as an adult, get an exam within 5 years of diagnosis and then yearly.  If you are diagnosed with type 2 diabetes, get an exam as soon as possible after the diagnosis and then yearly. Foot care exam  Visual foot exams are performed  at every routine medical visit. The exams check for cuts, injuries, or other problems with the feet.  You should have a complete foot exam performed every year. This exam includes an inspection of the structure and skin of your feet, a check of the pulses in your feet, and a check of the sensation in your feet.  Type 1 diabetes: The first exam is performed 5 years after diagnosis.  Type 2 diabetes: The first exam is performed at the time of diagnosis.  Check your feet nightly for cuts, injuries, or other problems with your feet. Tell your health care provider if anything is not healing. Kidney function test (urine microalbumin)  This test is performed once a year.  Type 1 diabetes: The first test is performed 5 years after diagnosis.  Type 2 diabetes: The first test is performed at the time of diagnosis.  A serum creatinine and estimated glomerular filtration rate (eGFR) test is done once a year to assess the level of chronic kidney disease (CKD), if present. Lipid profile (cholesterol, HDL, LDL, triglycerides)  Performed every 5 years for most people.  The goal for LDL is less than 100 mg/dL. If you are at high risk, the goal is less than 70 mg/dL.  The goal for HDL is 40 mg/dL-50 mg/dL for men and 50 mg/dL-60 mg/dL for women. An HDL  cholesterol of 60 mg/dL or higher gives some protection against heart disease.  The goal for triglycerides is less than 150 mg/dL. Immunizations  The flu (influenza) vaccine is recommended yearly for every person 24 months of age or older who has diabetes.  The pneumonia (pneumococcal) vaccine is recommended for every person 40 years of age or older who has diabetes. Adults 14 years of age or older may receive the pneumonia vaccine as a series of two separate shots.  The hepatitis B vaccine is recommended for adults shortly after they have been diagnosed with diabetes.  The Tdap (tetanus, diphtheria, and pertussis) vaccine should be given:  According  to normal childhood vaccination schedules, for children.  Every 10 years, for adults who have diabetes. Diabetes self-management education  Education is recommended at diagnosis and ongoing as needed. Treatment plan  Your treatment plan is reviewed at every medical visit.   This information is not intended to replace advice given to you by your health care provider. Make sure you discuss any questions you have with your health care provider.   Document Released: 08/23/2009 Document Revised: 11/16/2014 Document Reviewed: 03/28/2013 Elsevier Interactive Patient Education [employer].

## 2015-12-24 NOTE — Assessment & Plan Note (Signed)
Checking HgA1c, She is currently taking amaryl, victoza. Previously on invokana. Checking microalbumin to creatinine ratio. She is on benazepril. Foot exam done today and will get records of her eye exam.

## 2015-12-24 NOTE — Progress Notes (Signed)
Pre visit review using our clinic review tool, if applicable. No additional management support is needed unless otherwise documented below in the visit note. 

## 2016-03-09 ENCOUNTER — Ambulatory Visit: Payer: BLUE CROSS/BLUE SHIELD | Admitting: Internal Medicine

## 2016-03-09 ENCOUNTER — Encounter: Payer: Self-pay | Admitting: Internal Medicine

## 2016-03-11 ENCOUNTER — Other Ambulatory Visit: Payer: Self-pay | Admitting: Geriatric Medicine

## 2016-03-11 ENCOUNTER — Telehealth: Payer: Self-pay | Admitting: Geriatric Medicine

## 2016-03-11 NOTE — Telephone Encounter (Signed)
Patient needs an office visit before any refills will be filled. She has only been here once as a new patient, with no follow up. She cancelled her follow up appt.

## 2016-03-12 ENCOUNTER — Other Ambulatory Visit: Payer: Self-pay | Admitting: Geriatric Medicine

## 2016-03-12 MED ORDER — HYDROXYZINE PAMOATE 25 MG PO CAPS: 50.0000 mg | ORAL_CAPSULE | Freq: Four times a day (QID) | ORAL | Status: DC | PRN

## 2016-03-12 MED ORDER — GLIMEPIRIDE 4 MG PO TABS: 4.0000 mg | ORAL_TABLET | Freq: Every day | ORAL | Status: DC

## 2016-03-12 MED ORDER — CANAGLIFLOZIN 300 MG PO TABS: 300.0000 mg | ORAL_TABLET | Freq: Every day | ORAL | Status: DC

## 2016-03-12 MED ORDER — BENAZEPRIL HCL 40 MG PO TABS
40.0000 mg | ORAL_TABLET | Freq: Every day | ORAL | Status: DC
Start: 2016-03-12 — End: 2016-04-15

## 2016-03-12 MED ORDER — MONTELUKAST SODIUM 10 MG PO TABS: 10.0000 mg | ORAL_TABLET | Freq: Every day | ORAL | Status: DC

## 2016-03-12 MED ORDER — LIRAGLUTIDE 18 MG/3ML ~~LOC~~ SOPN: 18.0000 mg | PEN_INJECTOR | Freq: Every day | SUBCUTANEOUS | Status: DC

## 2016-03-12 MED ORDER — ROSUVASTATIN CALCIUM 20 MG PO TABS: 20.0000 mg | ORAL_TABLET | Freq: Every day | ORAL | Status: DC

## 2016-03-12 MED ORDER — PANTOPRAZOLE SODIUM 40 MG PO TBEC: 40.0000 mg | DELAYED_RELEASE_TABLET | Freq: Every day | ORAL | Status: DC

## 2016-03-12 NOTE — Telephone Encounter (Signed)
Patient states she has appointment in June.  Patient is requesting scripts to get her through to that point.  That was the soonest she could get back in with [facility]. States Okey DupreCrawford was to send initial scripts on first visit but did not get right away.  Patient states that she is worried about her A1C dropping due to not having medication.  Please follow up in regards at [phone].

## 2016-03-12 NOTE — Telephone Encounter (Signed)
Sent in refills to get patient through until her appt in June. Left message informing patient.

## 2016-03-23 ENCOUNTER — Ambulatory Visit: Payer: BLUE CROSS/BLUE SHIELD | Admitting: Internal Medicine

## 2016-04-15 ENCOUNTER — Encounter: Payer: Self-pay | Admitting: Internal Medicine

## 2016-04-15 ENCOUNTER — Ambulatory Visit (INDEPENDENT_AMBULATORY_CARE_PROVIDER_SITE_OTHER): Payer: BLUE CROSS/BLUE SHIELD | Admitting: Internal Medicine

## 2016-04-15 ENCOUNTER — Other Ambulatory Visit (INDEPENDENT_AMBULATORY_CARE_PROVIDER_SITE_OTHER): Payer: BLUE CROSS/BLUE SHIELD

## 2016-04-15 VITALS — BP 124/72 | HR 84 | Temp 98.4°F | Resp 20 | Wt 255.0 lb

## 2016-04-15 DIAGNOSIS — IMO0001 Reserved for inherently not codable concepts without codable children: Secondary | ICD-10-CM

## 2016-04-15 DIAGNOSIS — E119 Type 2 diabetes mellitus without complications: Secondary | ICD-10-CM | POA: Diagnosis not present

## 2016-04-15 DIAGNOSIS — E1165 Type 2 diabetes mellitus with hyperglycemia: Secondary | ICD-10-CM

## 2016-04-15 LAB — HEMOGLOBIN A1C: HEMOGLOBIN A1C: 10.2 % — AB (ref 4.6–6.5)

## 2016-04-15 MED ORDER — GLIMEPIRIDE 4 MG PO TABS: 4.0000 mg | ORAL_TABLET | Freq: Every day | ORAL | Status: DC

## 2016-04-15 MED ORDER — LIRAGLUTIDE 18 MG/3ML ~~LOC~~ SOPN: 18.0000 mg | PEN_INJECTOR | Freq: Every day | SUBCUTANEOUS | Status: DC

## 2016-04-15 MED ORDER — CANAGLIFLOZIN 300 MG PO TABS: 300.0000 mg | ORAL_TABLET | Freq: Every day | ORAL | Status: DC

## 2016-04-15 MED ORDER — BENAZEPRIL HCL 40 MG PO TABS: 40.0000 mg | ORAL_TABLET | Freq: Every day | ORAL | Status: DC

## 2016-04-15 MED ORDER — ROSUVASTATIN CALCIUM 20 MG PO TABS: 20.0000 mg | ORAL_TABLET | Freq: Every day | ORAL | Status: DC

## 2016-04-15 MED ORDER — MONTELUKAST SODIUM 10 MG PO TABS: 10.0000 mg | ORAL_TABLET | Freq: Every day | ORAL | Status: AC

## 2016-04-15 NOTE — Assessment & Plan Note (Signed)
Checking HgA1c and adjust as needed. Suspect she will still not be at goal but has not been taking the victoza consistently due to cost right now. Taking invokana, amaryl as well.

## 2016-04-15 NOTE — Assessment & Plan Note (Signed)
Weight down about 2 pounds since last time which is good considering the stress she is under.
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
## 2016-04-15 NOTE — Patient Instructions (Signed)
We have sent in the refills to the pharmacy and are checking the labs today.   I have given you some information about some free counseling that is available to you until you are able to get back in once school is started.   I'm really sorry about everything you are going through and hope you have a good relaxing time at the beach.

## 2016-04-15 NOTE — Progress Notes (Signed)
Pre visit review using our clinic review tool, if applicable. No additional management support is needed unless otherwise documented below in the visit note. 

## 2016-05-01 ENCOUNTER — Ambulatory Visit (INDEPENDENT_AMBULATORY_CARE_PROVIDER_SITE_OTHER): Payer: BLUE CROSS/BLUE SHIELD | Admitting: Obstetrics & Gynecology

## 2016-05-01 ENCOUNTER — Encounter: Payer: Self-pay | Admitting: Obstetrics & Gynecology

## 2016-05-01 VITALS — BP 142/101 | HR 99 | Wt 255.3 lb

## 2016-05-01 DIAGNOSIS — Z113 Encounter for screening for infections with a predominantly sexual mode of transmission: Secondary | ICD-10-CM | POA: Diagnosis not present

## 2016-05-01 DIAGNOSIS — Z3202 Encounter for pregnancy test, result negative: Secondary | ICD-10-CM | POA: Diagnosis not present

## 2016-05-01 DIAGNOSIS — L298 Other pruritus: Secondary | ICD-10-CM | POA: Diagnosis not present

## 2016-05-01 DIAGNOSIS — N898 Other specified noninflammatory disorders of vagina: Secondary | ICD-10-CM

## 2016-05-01 DIAGNOSIS — Z30013 Encounter for initial prescription of injectable contraceptive: Secondary | ICD-10-CM

## 2016-05-01 DIAGNOSIS — B9689 Other specified bacterial agents as the cause of diseases classified elsewhere: Secondary | ICD-10-CM | POA: Diagnosis not present

## 2016-05-01 DIAGNOSIS — Z30017 Encounter for initial prescription of implantable subdermal contraceptive: Secondary | ICD-10-CM

## 2016-05-01 DIAGNOSIS — Z3046 Encounter for surveillance of implantable subdermal contraceptive: Secondary | ICD-10-CM

## 2016-05-01 DIAGNOSIS — N760 Acute vaginitis: Secondary | ICD-10-CM | POA: Diagnosis not present

## 2016-05-01 DIAGNOSIS — Z0000 Encounter for general adult medical examination without abnormal findings: Secondary | ICD-10-CM

## 2016-05-01 LAB — POCT PREGNANCY, URINE: PREG TEST UR: NEGATIVE

## 2016-05-01 MED ORDER — ETONOGESTREL 68 MG ~~LOC~~ IMPL
68.0000 mg | DRUG_IMPLANT | Freq: Once | SUBCUTANEOUS | Status: AC
  Administered 2016-05-01: 68 mg via SUBCUTANEOUS
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
## 2016-05-02 LAB — RPR

## 2016-05-02 LAB — WET PREP, GENITAL: Trich, Wet Prep: NONE SEEN

## 2016-05-02 LAB — HIV ANTIBODY (ROUTINE TESTING W REFLEX): HIV 1&2 Ab, 4th Generation: NONREACTIVE

## 2016-05-02 LAB — HEPATITIS B SURFACE ANTIGEN: HEP B S AG: NEGATIVE

## 2016-05-02 LAB — HEPATITIS C ANTIBODY: HCV Ab: NEGATIVE

## 2016-05-04 LAB — GC/CHLAMYDIA PROBE AMP (~~LOC~~) NOT AT ARMC
CHLAMYDIA, DNA PROBE: NEGATIVE
NEISSERIA GONORRHEA: NEGATIVE

## 2016-05-05 ENCOUNTER — Telehealth: Payer: Self-pay

## 2016-05-05 NOTE — Telephone Encounter (Signed)
Pt left a message yesterday on nurse line stating Dr.Dove was suppose to call in a medication for her but she was going to research the medicine first and let the patient know what to do. Pt forget to ask her own the way out. Also, patient would like to know when she should follow up.

## 2016-05-18 ENCOUNTER — Other Ambulatory Visit: Payer: Self-pay

## 2016-05-18 ENCOUNTER — Telehealth: Payer: Self-pay

## 2016-05-18 MED ORDER — FLUCONAZOLE 150 MG PO TABS: 150.0000 mg | ORAL_TABLET | Freq: Once | ORAL | Status: DC

## 2016-05-18 MED ORDER — METRONIDAZOLE 500 MG PO TABS
500.0000 mg | ORAL_TABLET | Freq: Two times a day (BID) | ORAL | Status: DC
Start: 2016-05-18 — End: 2016-10-21

## 2016-05-18 NOTE — Telephone Encounter (Signed)
Error

## 2016-05-18 NOTE — Telephone Encounter (Signed)
Pt stated you were going to call in Flagyl for her BV that was seen on exam 05/01/2016. I have called in Flagyl and Diflucan  Thanks

## 2016-05-18 NOTE — Telephone Encounter (Signed)
Per office note patient needed to have Diflucan 150 mg for yeast infection and Flagyl with BV which was also called into the pharmacy.

## 2016-07-21 ENCOUNTER — Encounter: Payer: Self-pay | Admitting: Internal Medicine

## 2016-07-21 ENCOUNTER — Ambulatory Visit (INDEPENDENT_AMBULATORY_CARE_PROVIDER_SITE_OTHER): Payer: BLUE CROSS/BLUE SHIELD | Admitting: Internal Medicine

## 2016-07-21 VITALS — BP 110/78 | HR 106 | Temp 98.2°F | Resp 16 | Ht 61.0 in | Wt 258.0 lb

## 2016-07-21 DIAGNOSIS — F331 Major depressive disorder, recurrent, moderate: Secondary | ICD-10-CM

## 2016-07-21 DIAGNOSIS — F339 Major depressive disorder, recurrent, unspecified: Secondary | ICD-10-CM | POA: Insufficient documentation

## 2016-07-21 DIAGNOSIS — E1165 Type 2 diabetes mellitus with hyperglycemia: Secondary | ICD-10-CM

## 2016-07-21 DIAGNOSIS — Z23 Encounter for immunization: Secondary | ICD-10-CM | POA: Diagnosis not present

## 2016-07-21 MED ORDER — CITALOPRAM HYDROBROMIDE 20 MG PO TABS
20.0000 mg | ORAL_TABLET | Freq: Every day | ORAL | 11 refills | Status: DC
Start: 2016-07-21 — End: 2016-10-21

## 2016-07-21 MED ORDER — FLUCONAZOLE 150 MG PO TABS: 150.0000 mg | ORAL_TABLET | ORAL | 3 refills | Status: DC

## 2016-07-21 MED ORDER — BUPROPION HCL ER (XL) 150 MG PO TB24: 150.0000 mg | ORAL_TABLET | Freq: Every day | ORAL | 11 refills | Status: DC
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
## 2016-07-21 NOTE — Patient Instructions (Signed)
We have sent in the diflucan refills for you.  We have sent in celexa 20 mg. Start off taking 1 pill daily for the first week. Then you can increase to 40 mg daily (2 pills).   You can also start the wellbutrin 150 mg pill daily (it is a long acting form). Take 1 pill daily for the first 2 weeks. Then if you are still struggling you can increase to 2 pills daily.   It will take 3-4 weeks of being on the celexa and wellbutrin before you will notice the full benefit. It you have increasing suicidal thoughts please seek care as this is a rare but serious side effect of the medication.   We are not checking blood work today but will check next time.

## 2016-07-21 NOTE — Progress Notes (Signed)
Pre visit review using our clinic review tool, if applicable. No additional management support is needed unless otherwise documented below in the visit note. 

## 2016-07-21 NOTE — Assessment & Plan Note (Addendum)
Rx for wellbutrin and for celexa today with instruction on titration to effective dosing. She will continue to see her therapist and if this is not effective she will need to see a psychiatrist and she understands that. Warned about risk of SI and she will seek care. Still also using hydroxyzine for anxiety with some relief. This episode is moderate to severe and is accompanied by anxiety.

## 2016-07-21 NOTE — Assessment & Plan Note (Signed)
Will not check labs today as she is off all meds and likely they will be higher. Have recommended her to get eyes evaluated. We talked about the risk of complication the longer her sugars are uncontrolled.

## 2016-07-21 NOTE — Assessment & Plan Note (Signed)
Weight is up several pounds from last visit. She does not have time to exercise but does try to meal plan.

## 2016-07-31 ENCOUNTER — Encounter: Payer: Self-pay | Admitting: Internal Medicine

## 2016-07-31 ENCOUNTER — Other Ambulatory Visit: Payer: Self-pay | Admitting: Geriatric Medicine

## 2016-08-04 MED ORDER — CETIRIZINE HCL 10 MG PO TABS
10.0000 mg | ORAL_TABLET | Freq: Every day | ORAL | 11 refills | Status: DC
Start: 2016-08-04 — End: 2017-05-24

## 2016-08-04 MED ORDER — INSULIN PEN NEEDLE 30G X 8 MM MISC: 11 refills | Status: AC

## 2016-08-27 ENCOUNTER — Telehealth: Payer: Self-pay | Admitting: *Deleted

## 2016-08-27 MED ORDER — TRAZODONE HCL 50 MG PO TABS: 25.0000 mg | ORAL_TABLET | Freq: Every evening | ORAL | 0 refills | Status: DC | PRN

## 2016-08-27 MED ORDER — HYDROXYZINE PAMOATE 25 MG PO CAPS: 50.0000 mg | ORAL_CAPSULE | Freq: Four times a day (QID) | ORAL | 1 refills | Status: DC | PRN

## 2016-08-27 MED ORDER — GLUCOSE BLOOD VI STRP
1.0000 | ORAL_STRIP | Freq: Two times a day (BID) | 3 refills | Status: AC
Start: 2016-08-27 — End: ?

## 2016-08-27 MED ORDER — ACCU-CHEK SOFT TOUCH LANCETS MISC: 1.0000 | Freq: Two times a day (BID) | 3 refills | Status: DC

## 2016-08-27 NOTE — Telephone Encounter (Signed)
Trazodone sent to pharmacy. Follow up with Dr. Okey Duprerawford for refills.

## 2016-08-27 NOTE — Telephone Encounter (Signed)
Notified pt refills has been sent to [facility].../lmb 

## 2016-08-27 NOTE — Telephone Encounter (Signed)
Left msg on triage stating she is needing refills on her hydralazine, accu-chek aviva strips & lancets. Also ? About rx for trazodone. She stated she sent MD a mychart msg letting her know she would like med, but she never heard back. Per chart mychart msg came on 9/22, but no response. MD is out of the office pls advise on script for Trazodone, others has been sent to [facility].../l;mb

## 2016-10-21 ENCOUNTER — Encounter: Payer: Self-pay | Admitting: Internal Medicine

## 2016-10-21 ENCOUNTER — Ambulatory Visit (INDEPENDENT_AMBULATORY_CARE_PROVIDER_SITE_OTHER): Payer: BLUE CROSS/BLUE SHIELD | Admitting: Internal Medicine

## 2016-10-21 ENCOUNTER — Other Ambulatory Visit (INDEPENDENT_AMBULATORY_CARE_PROVIDER_SITE_OTHER): Payer: BLUE CROSS/BLUE SHIELD

## 2016-10-21 VITALS — BP 100/56 | HR 97 | Temp 98.5°F | Resp 14 | Ht 61.0 in | Wt 255.0 lb

## 2016-10-21 DIAGNOSIS — F331 Major depressive disorder, recurrent, moderate: Secondary | ICD-10-CM | POA: Diagnosis not present

## 2016-10-21 DIAGNOSIS — E1165 Type 2 diabetes mellitus with hyperglycemia: Secondary | ICD-10-CM

## 2016-10-21 DIAGNOSIS — R21 Rash and other nonspecific skin eruption: Secondary | ICD-10-CM

## 2016-10-21 LAB — COMPREHENSIVE METABOLIC PANEL WITH GFR
ALT: 21 U/L (ref 0–35)
AST: 15 U/L (ref 0–37)
Albumin: 4.3 g/dL (ref 3.5–5.2)
Alkaline Phosphatase: 87 U/L (ref 39–117)
BUN: 14 mg/dL (ref 6–23)
CO2: 19 meq/L (ref 19–32)
Calcium: 9.1 mg/dL (ref 8.4–10.5)
Chloride: 104 meq/L (ref 96–112)
Creatinine, Ser: 0.47 mg/dL (ref 0.40–1.20)
GFR: 165.7 mL/min
Glucose, Bld: 185 mg/dL — ABNORMAL HIGH (ref 70–99)
Potassium: 3.8 meq/L (ref 3.5–5.1)
Sodium: 136 meq/L (ref 135–145)
Total Bilirubin: 0.5 mg/dL (ref 0.2–1.2)
Total Protein: 7.6 g/dL (ref 6.0–8.3)

## 2016-10-21 LAB — HEMOGLOBIN A1C: Hgb A1c MFr Bld: 8.8 % — ABNORMAL HIGH (ref 4.6–6.5)

## 2016-10-21 MED ORDER — TRAZODONE HCL 100 MG PO TABS: 100.0000 mg | ORAL_TABLET | Freq: Every evening | ORAL | 3 refills | Status: DC | PRN

## 2016-10-21 MED ORDER — TRIAMCINOLONE ACETONIDE 0.1 % EX CREA: 1.0000 "application " | TOPICAL_CREAM | Freq: Two times a day (BID) | CUTANEOUS | 2 refills | Status: DC

## 2016-10-21 MED ORDER — CITALOPRAM HYDROBROMIDE 40 MG PO TABS: 40.0000 mg | ORAL_TABLET | Freq: Every day | ORAL | 3 refills | Status: DC

## 2016-10-21 MED ORDER — BUPROPION HCL ER (XL) 300 MG PO TB24: 300.0000 mg | ORAL_TABLET | Freq: Every day | ORAL | 3 refills | Status: DC

## 2016-10-21 NOTE — Patient Instructions (Signed)
We have sent in the refills of the celexa and wellbutrin so they are the right dose. Just ask for the 40 mg celexa and 300 mg wellbutrin when you are at the pharmacy next time for refills.   Keep up the good work with the sugars

## 2016-10-21 NOTE — Progress Notes (Signed)
Pre visit review using our clinic review tool, if applicable. No additional management support is needed unless otherwise documented below in the visit note. 

## 2016-10-21 NOTE — Progress Notes (Signed)
   Subjective:    Patient ID: Rita Hogan, Rita Hogan    DOB: 11-Jul-1986, 30 y.o.   MRN: [id number]  HPI The patient is a 30 YO Rita Hogan coming in for follow up of her major depression (taking celexa 40 mg and wellbutrin 300 mg daily since last viist, these have helped, she is doing some counseling as she can afford it, she denies SI/HI, overall improved about 75% since last visit, still some social isolation although she is working on that daily, no problems with work or school) and her diabetes (out of control previously due to dietary changes and not taking her medications due to cost, she is now back on her medications and checking sugars rarely, none above 200 which is good for her, none below 80 either, she is having less thirst and urination so she knows she is doing some better). She is also having worsening of her rash on her elbow (left elbow and always gets worse in the winter, she is out of her cream as she lost it in the move recently, uses the cream with good results).   Review of Systems  Constitutional: Negative for activity change, appetite change, fatigue, fever and unexpected weight change.  Respiratory: Negative.   Cardiovascular: Negative.   Gastrointestinal: Negative.   Musculoskeletal: Negative.   Skin: Negative.   Neurological: Negative.   Psychiatric/Behavioral: Positive for dysphoric mood and sleep disturbance. Negative for confusion, decreased concentration, hallucinations, self-injury and suicidal ideas. The patient is not nervous/anxious and is not hyperactive.       Objective:   Physical Exam  Constitutional: She is oriented to person, place, and time. She appears well-developed and well-nourished.  Obese  HENT:  Head: Normocephalic and atraumatic.  Eyes: EOM are normal.  Cardiovascular: Normal rate and regular rhythm.   Pulmonary/Chest: Effort normal. No respiratory distress. She has no wheezes. She has no rales.  Abdominal: Soft. She exhibits no distension.  There is no tenderness. There is no rebound.  Neurological: She is alert and oriented to person, place, and time.  Skin: Skin is warm and dry.  Psychiatric:  Mood much improved from prior   Vitals:   10/21/16 0806  BP: (!) 100/56  Pulse: 97  Resp: 14  Temp: 98.5 F (36.9 C)  TempSrc: Oral  SpO2: 98%  Weight: 255 lb (115.7 kg)  Height: 5\' 1"  (1.549 m)      Assessment & Plan:

## 2016-10-21 NOTE — Assessment & Plan Note (Signed)
Taking her amaryl, invokana, victoza. Can add actos if HgA1c above goal. Checking HgA1c and CMP today and adjust as needed. Hers is complicated by high sugars.

## 2016-10-21 NOTE — Assessment & Plan Note (Signed)
Rx for triamcinolone cream is refilled. Susepct atopic dermatitis.

## 2016-10-21 NOTE — Assessment & Plan Note (Signed)
Previously moderate to severe and now rated mild to moderate. She is taking celexa 40 mg and wellbutrin 300 mg daily. Changing her rx to correct dosing. She is not having SI/HI and encouraged her to continue to do her counseling when she can.

## 2016-10-28 ENCOUNTER — Other Ambulatory Visit: Payer: Self-pay | Admitting: Internal Medicine

## 2016-12-08 ENCOUNTER — Other Ambulatory Visit: Payer: Self-pay | Admitting: Internal Medicine

## 2017-01-14 ENCOUNTER — Other Ambulatory Visit: Payer: Self-pay | Admitting: Internal Medicine

## 2017-01-19 ENCOUNTER — Ambulatory Visit: Payer: BLUE CROSS/BLUE SHIELD | Admitting: Internal Medicine

## 2017-02-09 ENCOUNTER — Ambulatory Visit: Payer: BLUE CROSS/BLUE SHIELD | Admitting: Internal Medicine

## 2017-02-19 ENCOUNTER — Encounter (HOSPITAL_COMMUNITY): Payer: Self-pay | Admitting: Emergency Medicine

## 2017-02-19 ENCOUNTER — Emergency Department (HOSPITAL_COMMUNITY): Payer: BLUE CROSS/BLUE SHIELD

## 2017-02-19 ENCOUNTER — Emergency Department (HOSPITAL_COMMUNITY)
Admission: EM | Admit: 2017-02-19 | Discharge: 2017-02-19 | Disposition: A | Discharge status: Home / Self Care | Payer: BLUE CROSS/BLUE SHIELD | Attending: Emergency Medicine | Admitting: Emergency Medicine

## 2017-02-19 DIAGNOSIS — W5501XA Bitten by cat, initial encounter: Secondary | ICD-10-CM | POA: Diagnosis not present

## 2017-02-19 DIAGNOSIS — Y999 Unspecified external cause status: Secondary | ICD-10-CM | POA: Insufficient documentation

## 2017-02-19 DIAGNOSIS — E119 Type 2 diabetes mellitus without complications: Secondary | ICD-10-CM | POA: Insufficient documentation

## 2017-02-19 DIAGNOSIS — L03113 Cellulitis of right upper limb: Secondary | ICD-10-CM | POA: Insufficient documentation

## 2017-02-19 DIAGNOSIS — Y939 Activity, unspecified: Secondary | ICD-10-CM | POA: Diagnosis not present

## 2017-02-19 DIAGNOSIS — Z23 Encounter for immunization: Secondary | ICD-10-CM | POA: Diagnosis not present

## 2017-02-19 DIAGNOSIS — I10 Essential (primary) hypertension: Secondary | ICD-10-CM | POA: Insufficient documentation

## 2017-02-19 DIAGNOSIS — Y929 Unspecified place or not applicable: Secondary | ICD-10-CM | POA: Diagnosis not present

## 2017-02-19 DIAGNOSIS — J45909 Unspecified asthma, uncomplicated: Secondary | ICD-10-CM | POA: Diagnosis not present

## 2017-02-19 DIAGNOSIS — Z794 Long term (current) use of insulin: Secondary | ICD-10-CM | POA: Diagnosis not present

## 2017-02-19 DIAGNOSIS — Z203 Contact with and (suspected) exposure to rabies: Secondary | ICD-10-CM | POA: Insufficient documentation

## 2017-02-19 DIAGNOSIS — Z87891 Personal history of nicotine dependence: Secondary | ICD-10-CM | POA: Insufficient documentation

## 2017-02-19 DIAGNOSIS — M7989 Other specified soft tissue disorders: Secondary | ICD-10-CM | POA: Diagnosis present

## 2017-02-19 LAB — CBG MONITORING, ED: Glucose-Capillary: 266 mg/dL — ABNORMAL HIGH (ref 65–99)

## 2017-02-19 MED ORDER — AMOXICILLIN-POT CLAVULANATE 875-125 MG PO TABS: 1.0000 | ORAL_TABLET | Freq: Two times a day (BID) | ORAL | 0 refills | Status: DC

## 2017-02-19 MED ORDER — TRAMADOL HCL 50 MG PO TABS: 50.0000 mg | ORAL_TABLET | Freq: Four times a day (QID) | ORAL | 0 refills | Status: DC | PRN

## 2017-02-19 MED ORDER — PIPERACILLIN-TAZOBACTAM 3.375 G IVPB
3.3750 g | Freq: Once | INTRAVENOUS | Status: AC
  Administered 2017-02-19: 3.375 g via INTRAVENOUS
  Filled 2017-02-19: qty 50

## 2017-02-19 MED ORDER — RABIES VACCINE, PCEC IM SUSR
1.0000 mL | Freq: Once | INTRAMUSCULAR | Status: AC
  Administered 2017-02-19: 1 mL via INTRAMUSCULAR
  Filled 2017-02-19: qty 1

## 2017-02-19 MED ORDER — RABIES IMMUNE GLOBULIN 150 UNIT/ML IM INJ
20.0000 [IU]/kg | INJECTION | Freq: Once | INTRAMUSCULAR | Status: AC
  Administered 2017-02-19: 2325 [IU] via INTRAMUSCULAR
  Filled 2017-02-19: qty 15.5

## 2017-02-19 NOTE — ED Provider Notes (Signed)
[facility]-EMERGENCY DEPT Provider Note   CSN: [id number] Arrival date & time: 02/19/17  1459 By signing my name below, I, Bridgette Habermann, attest that this documentation has been prepared under the direction and in the presence of [employer], PA-C. Electronically Signed: Bridgette Habermann, ED Scribe. 02/19/17. 3:22 PM.  History   Chief Complaint Chief Complaint  Patient presents with  . Animal Bite    HPI The history is provided by the patient. No language interpreter was used.   HPI Comments: Rita Hogan is a 31 y.o. female with h/o asthma, DM, HLD, and HTN, who presents to the Emergency Department complaining of a moderate, gradually worsening area of pain and swelling to the right hand onset yesterday. Pt reports she was bit by a neighborhood Engineer, structural. Pt states pain is exacerbated with palpation and direct pressure. No treatments tried PTA. Denies drainage from the area. Is unsure if cat's immunizations are UTD. Pt's is unsure if her tetanus is updated.  Pt additionally states she has been having a productive cough for the past week. She also has associated congestion and sore throat. She has been seen at [facility] several days ago and had a negative strep test. She notes her roommate had the flu. Reports h/o DM and admits she does not check her CBG regularly. Denies fever, chills, or any other associated symptoms.  Past Medical History:  Diagnosis Date  . Allergy   . Asthma   . Depression   . Diabetes mellitus   . Heart murmur   . Hyperlipidemia   . Hypertension   . Morbid obesity [facility])     Patient Active Problem List   Diagnosis Date Noted  . Major depression, recurrent (HCC) 07/21/2016  . Essential hypertension 12/24/2015  . Morbid obesity (HCC) 12/24/2015  . Hidradenitis suppurativa 12/24/2015  . Rash and nonspecific skin eruption 12/24/2015  . Type 2 diabetes mellitus (HCC) 07/09/2011    Past Surgical History:  Procedure Laterality Date  . EYE SURGERY    .  PREPATELLAR BURSA EXCISION     right    OB History    Gravida Para Term Preterm AB Living   0             SAB TAB Ectopic Multiple Live Births                   Home Medications    Prior to Admission medications   Medication Sig Start Date End Date Taking? Authorizing Provider  amoxicillin-clavulanate (AUGMENTIN) 875-125 MG tablet Take 1 tablet by mouth every 12 (twelve) hours. 02/19/17   Eyvonne Mechanic, PA-C  benazepril (LOTENSIN) 40 MG tablet Take 1 tablet (40 mg total) by mouth daily. 04/15/16   Myrlene Broker, MD  buPROPion (WELLBUTRIN XL) 300 MG 24 hr tablet TAKE 1 TABLET(300 MG) BY MOUTH DAILY 10/29/16   Myrlene Broker, MD  canagliflozin [facility]) 300 MG TABS tablet Take 1 tablet (300 mg total) by mouth daily before breakfast. Reported on 12/24/2015 04/15/16   Myrlene Broker, MD  cetirizine (ZYRTEC) 10 MG tablet Take 1 tablet (10 mg total) by mouth daily. 08/04/16   Myrlene Broker, MD  citalopram (CELEXA) 40 MG tablet TAKE 1 TABLET(40 MG) BY MOUTH DAILY 10/29/16   Myrlene Broker, MD  fluconazole (DIFLUCAN) 150 MG tablet Take 1 tablet (150 mg total) by mouth every 3 (three) days. 07/21/16   Myrlene Broker, MD  glimepiride (AMARYL) 4 MG tablet Take 1 tablet (4 mg total)  by mouth daily with breakfast. 04/15/16   Myrlene Broker, MD  glucose blood (ACCU-CHEK AVIVA PLUS) test strip 1 each by Other route 2 (two) times daily. Use to check blood sugar twice a day. Dx E11.9 08/27/16   Myrlene Broker, MD  hydrOXYzine (VISTARIL) 25 MG capsule TAKE 2 CAPSULES(50 MG) BY MOUTH EVERY 6 HOURS AS NEEDED FOR ANXIETY 01/14/17   Myrlene Broker, MD  Insulin Pen Needle (SURE COMFORT PEN NEEDLES) 30G X 8 MM MISC Use for injections 08/04/16   Myrlene Broker, MD  montelukast (SINGULAIR) 10 MG tablet Take 1 tablet (10 mg total) by mouth daily. 04/15/16   Myrlene Broker, MD  rosuvastatin (CRESTOR) 20 MG tablet Take 1 tablet (20 mg total) by mouth daily.  Reported on 12/24/2015 04/15/16   Myrlene Broker, MD  traMADol (ULTRAM) 50 MG tablet Take 1 tablet (50 mg total) by mouth every 6 (six) hours as needed. 02/19/17   Eyvonne Mechanic, PA-C  traZODone (DESYREL) 100 MG tablet Take 1 tablet (100 mg total) by mouth at bedtime as needed for sleep. 10/21/16   Myrlene Broker, MD  triamcinolone cream (KENALOG) 0.1 % Apply 1 application topically 2 (two) times daily. 10/21/16   Myrlene Broker, MD  VICTOZA 18 MG/3ML SOPN ADMINISTER 1.8 MG UNDER THE SKIN EVERY DAY 01/14/17   Myrlene Broker, MD    Family History Family History  Problem Relation Age of Onset  . Adopted: Yes    Social History Social History  Substance Use Topics  . Smoking status: Former Smoker    Quit date: 07/13/2012  . Smokeless tobacco: Former Neurosurgeon  . Alcohol use Yes     Comment: rare     Allergies   Metformin and related; Other; and Oxycodone   Review of Systems Review of Systems  Constitutional: Negative for chills and fever.  HENT: Positive for congestion and sore throat.   Respiratory: Positive for cough.   Skin: Positive for color change and wound.  All other systems reviewed and are negative.  Physical Exam Updated Vital Signs BP (!) 147/105 (BP Location: Left Arm)   Pulse (!) 120   Temp 98.1 F (36.7 C) (Oral)   Resp 18   Ht  (1.549 m)   Wt 115.7 kg   LMP 01/29/2017   SpO2 98%   BMI 48.18 kg/m   Physical Exam  Constitutional: She appears well-developed and well-nourished.  HENT:  Head: Normocephalic.  Eyes: Conjunctivae are normal.  Cardiovascular: Normal rate.   Pulmonary/Chest: Effort normal and breath sounds normal. No respiratory distress. She has no wheezes. She has no rales.  Abdominal: She exhibits no distension.  Musculoskeletal: Normal range of motion.  Swelling noted to the right hand with numerous puncture marks noted along the interdigit space of the first and second digit. Minor redness noted. No discharge or  fluctuance. Full active ROM of the hand.  Neurological: She is alert.  Skin: Skin is warm and dry.  Psychiatric: She has a normal mood and affect. Her behavior is normal.  Nursing note and vitals reviewed.  ED Treatments / Results  DIAGNOSTIC STUDIES: Oxygen Saturation is 98% on RA, normal by my interpretation.   COORDINATION OF CARE: 3:22 PM-Discussed next steps with pt. Pt verbalized understanding and is agreeable with the plan.   Labs (all labs ordered are listed, but only abnormal results are displayed) Labs Reviewed  CBG MONITORING, ED - Abnormal; Notable for the following:  Result Value   Glucose-Capillary 266 (*)    All other components within normal limits    EKG  EKG Interpretation None       Radiology Dg Chest 2 View  Result Date: 02/19/2017 CLINICAL DATA:  Shortness of breath and wheezing, chest discomfort. History of diabetes and hypertension. EXAM: CHEST  2 VIEW COMPARISON:  Chest radiograph January 16, 2014 FINDINGS: Cardiomediastinal silhouette is normal. No pleural effusions or focal consolidations. Trachea projects midline and there is no pneumothorax. Soft tissue planes and included osseous structures are non-suspicious. Large body habitus. IMPRESSION: Normal chest. Electronically Signed   By: Awilda Metro M.D.   On: 02/19/2017 15:55   Dg Hand Complete Right  Result Date: 02/19/2017 CLINICAL DATA:  Moderate gradually worsening pain and swelling after bit by cat. EXAM: RIGHT HAND - COMPLETE 3+ VIEW COMPARISON:  None. FINDINGS: There is no evidence of fracture or dislocation. There is no evidence of arthropathy or other focal bone abnormality. Soft tissues are unremarkable. IMPRESSION: Negative. Electronically Signed   By: Awilda Metro M.D.   On: 02/19/2017 15:57    Procedures Procedures (including critical care time)  Medications Ordered in ED Medications  piperacillin-tazobactam (ZOSYN) IVPB 3.375 g (3.375 g Intravenous New Bag/Given  02/19/17 1635)  rabies vaccine (RABAVERT) injection 1 mL (1 mL Intramuscular Given 02/19/17 1623)  rabies immune globulin (HYPERAB) injection 2,325 Units (2,325 Units Intramuscular Given 02/19/17 1622)     Initial Impression / Assessment and Plan / ED Course  I have reviewed the triage vital signs and the nursing notes.  Pertinent labs & imaging results that were available during my care of the patient were reviewed by me and considered in my medical decision making (see chart for details).      Final Clinical Impressions(s) / ED Diagnoses   Final diagnoses:  Cat bite, initial encounter  Cellulitis of right upper extremity   Labs: Point-of-care CBG  Imaging: DG Chest 2 View, DG hand complete  Consults:  Therapeutics: Zosyn, rabies vaccine, rabies immune globulin  Discharge Meds: Augmentin, ultram   Assessment/Plan:   31 year old female presents today status post Bite.  She has signs of infection in her right hand.  This is nonsevere at this time but due to the nature in time course concern that this may develop into significant infection.  She has no signs of abscess, normal plain films with no signs of foreign bodies.  Patient will receive a dose of Zosyn here via IV.  She was bitten by a stray cat, after discussing rabies potential patient would like to proceed with prophylactic treatment for rabies.  Patient also notes that she is uncontrolled diabetic.  I will check her CBG here.  Patient will likely be discharged home with Augmentin, and close primary care follow-up.  As it is Friday this may be difficult and patient will likely be reevaluated in the emergency room in 2 days if symptoms persist, sooner if they worsen.  Patient is afebrile here, she does have tachycardia, this is likely due to her anxiety and she was crying upon assessment.  Patient also notes an upper respiratory infection, clear lung sounds, negative chest x-ray.  She received medication.  Her CBG was 266 here.   Patient will be discharged home on Augmentin, encouraged to take her diabetic medication, keep remedy elevated.  Patient is given follow-up information for subsequent rabies vaccinations.  She is encouraged to return tomorrow if dramatic worsening of symptoms present, follow-up on Sunday for repeat evaluation if  no worsening of symptoms.  Patient is well-appearing and stable for discharge at this time.  Pt will follow up with primary care about Tdap vaccination status; she reports that she has been told by primary care that her tetanus is up-to-date for the last several years she will clarify this with her.   New Prescriptions New Prescriptions   AMOXICILLIN-CLAVULANATE (AUGMENTIN) 875-125 MG TABLET    Take 1 tablet by mouth every 12 (twelve) hours.   TRAMADOL (ULTRAM) 50 MG TABLET    Take 1 tablet (50 mg total) by mouth every 6 (six) hours as needed.   I personally performed the services described in this documentation, which was scribed in my presence. The recorded information has been reviewed and is accurate.    Eyvonne Mechanic, PA-C 02/19/17 1754    Maia Plan, MD 02/19/17 [phone]

## 2017-02-19 NOTE — Discharge Instructions (Signed)
Please read attached information. If you experience any new or worsening signs or symptoms please return to the emergency room for evaluation. Please follow-up with your primary care provider or specialist as discussed. Please use medication prescribed only as directed and discontinue taking if you have any concerning signs or symptoms.  Please follow-up with your primary care about Tdap vaccination status.

## 2017-02-19 NOTE — ED Triage Notes (Addendum)
Pt verbalizes bit in right hand by neighborhood stray cat yesterday. Redness/swelling at site noted; no streaking.

## 2017-02-20 ENCOUNTER — Observation Stay (HOSPITAL_COMMUNITY)
Admission: EM | Admit: 2017-02-20 | Discharge: 2017-02-21 | Disposition: A | Discharge status: Home / Self Care | Payer: BLUE CROSS/BLUE SHIELD | Attending: Internal Medicine | Admitting: Internal Medicine

## 2017-02-20 ENCOUNTER — Encounter (HOSPITAL_COMMUNITY): Payer: Self-pay

## 2017-02-20 ENCOUNTER — Emergency Department (HOSPITAL_COMMUNITY): Payer: BLUE CROSS/BLUE SHIELD

## 2017-02-20 DIAGNOSIS — Z87891 Personal history of nicotine dependence: Secondary | ICD-10-CM | POA: Diagnosis not present

## 2017-02-20 DIAGNOSIS — Y999 Unspecified external cause status: Secondary | ICD-10-CM | POA: Insufficient documentation

## 2017-02-20 DIAGNOSIS — I10 Essential (primary) hypertension: Secondary | ICD-10-CM | POA: Diagnosis present

## 2017-02-20 DIAGNOSIS — Y929 Unspecified place or not applicable: Secondary | ICD-10-CM | POA: Insufficient documentation

## 2017-02-20 DIAGNOSIS — Z79899 Other long term (current) drug therapy: Secondary | ICD-10-CM | POA: Insufficient documentation

## 2017-02-20 DIAGNOSIS — F339 Major depressive disorder, recurrent, unspecified: Secondary | ICD-10-CM | POA: Diagnosis present

## 2017-02-20 DIAGNOSIS — S61451A Open bite of right hand, initial encounter: Secondary | ICD-10-CM | POA: Diagnosis not present

## 2017-02-20 DIAGNOSIS — W5501XA Bitten by cat, initial encounter: Secondary | ICD-10-CM | POA: Diagnosis present

## 2017-02-20 DIAGNOSIS — Y939 Activity, unspecified: Secondary | ICD-10-CM | POA: Insufficient documentation

## 2017-02-20 DIAGNOSIS — E119 Type 2 diabetes mellitus without complications: Secondary | ICD-10-CM | POA: Diagnosis not present

## 2017-02-20 DIAGNOSIS — E1110 Type 2 diabetes mellitus with ketoacidosis without coma: Secondary | ICD-10-CM

## 2017-02-20 DIAGNOSIS — Z794 Long term (current) use of insulin: Secondary | ICD-10-CM | POA: Diagnosis not present

## 2017-02-20 DIAGNOSIS — J45909 Unspecified asthma, uncomplicated: Secondary | ICD-10-CM | POA: Diagnosis not present

## 2017-02-20 HISTORY — DX: Anxiety disorder, unspecified: F41.9

## 2017-02-20 LAB — GLUCOSE, CAPILLARY
GLUCOSE-CAPILLARY: 146 mg/dL — AB (ref 65–99)
Glucose-Capillary: 89 mg/dL (ref 65–99)
Glucose-Capillary: 157 mg/dL — ABNORMAL HIGH (ref 65–99)

## 2017-02-20 LAB — BASIC METABOLIC PANEL
ANION GAP: 13 (ref 5–15)
BUN: 9 mg/dL (ref 6–20)
CALCIUM: 8.7 mg/dL — AB (ref 8.9–10.3)
CO2: 17 mmol/L — AB (ref 22–32)
Chloride: 103 mmol/L (ref 101–111)
Creatinine, Ser: 0.41 mg/dL — ABNORMAL LOW (ref 0.44–1.00)
GFR calc Af Amer: >60 mL/min (ref >60)
GLUCOSE: 168 mg/dL — AB (ref 65–99)
Potassium: 4.8 mmol/L (ref 3.5–5.1)
Sodium: 133 mmol/L — ABNORMAL LOW (ref 135–145)

## 2017-02-20 LAB — CBC WITH DIFFERENTIAL/PLATELET
BASOS PCT: 0 %
Basophils Absolute: 0 10*3/uL (ref 0.0–0.1)
EOS PCT: 1 %
Eosinophils Absolute: 0.1 10*3/uL (ref 0.0–0.7)
HEMATOCRIT: 39.4 % (ref 36.0–46.0)
Hemoglobin: 14.1 g/dL (ref 12.0–15.0)
Lymphocytes Relative: 27 %
Lymphs Abs: 3 10*3/uL (ref 0.7–4.0)
MCH: 29.6 pg (ref 26.0–34.0)
MCHC: 35.8 g/dL (ref 30.0–36.0)
MCV: 82.6 fL (ref 78.0–100.0)
MONOS PCT: 9 %
Monocytes Absolute: 1 10*3/uL (ref 0.1–1.0)
NEUTROS ABS: 7.1 10*3/uL (ref 1.7–7.7)
Neutrophils Relative %: 63 %
Platelets: 320 10*3/uL (ref 150–400)
RBC: 4.77 MIL/uL (ref 3.87–5.11)
RDW: 12.3 % (ref 11.5–15.5)
WBC: 11.3 10*3/uL — ABNORMAL HIGH (ref 4.0–10.5)

## 2017-02-20 LAB — CG4 I-STAT (LACTIC ACID): LACTIC ACID, VENOUS: 1.09 mmol/L (ref 0.5–1.9)

## 2017-02-20 MED ORDER — ONDANSETRON HCL 4 MG/2ML IJ SOLN: 4.0000 mg | Freq: Four times a day (QID) | INTRAMUSCULAR | Status: DC | PRN

## 2017-02-20 MED ORDER — MORPHINE SULFATE (PF) 10 MG/ML IV SOLN: 4.0000 mg | Freq: Once | INTRAVENOUS | Status: DC

## 2017-02-20 MED ORDER — CITALOPRAM HYDROBROMIDE 20 MG PO TABS
40.0000 mg | ORAL_TABLET | Freq: Every day | ORAL | Status: DC
  Administered 2017-02-20 – 2017-02-21 (×2): 40 mg via ORAL
  Filled 2017-02-20 (×2): qty 2

## 2017-02-20 MED ORDER — TETANUS-DIPHTH-ACELL PERTUSSIS 5-2.5-18.5 LF-MCG/0.5 IM SUSP: 0.5000 mL | Freq: Once | INTRAMUSCULAR | Status: DC

## 2017-02-20 MED ORDER — FENTANYL CITRATE (PF) 100 MCG/2ML IJ SOLN
50.0000 ug | Freq: Once | INTRAMUSCULAR | Status: AC
Start: 2017-02-20 — End: 2017-02-20
  Administered 2017-02-20: 50 ug via INTRAVENOUS
  Filled 2017-02-20: qty 2

## 2017-02-20 MED ORDER — FENTANYL CITRATE (PF) 100 MCG/2ML IJ SOLN
25.0000 ug | INTRAMUSCULAR | Status: DC | PRN
  Administered 2017-02-20 (×2): 25 ug via INTRAVENOUS
  Filled 2017-02-20 (×3): qty 2

## 2017-02-20 MED ORDER — ACETAMINOPHEN 325 MG PO TABS: 650.0000 mg | ORAL_TABLET | Freq: Four times a day (QID) | ORAL | Status: DC | PRN

## 2017-02-20 MED ORDER — ACETAMINOPHEN 650 MG RE SUPP: 650.0000 mg | Freq: Four times a day (QID) | RECTAL | Status: DC | PRN

## 2017-02-20 MED ORDER — SODIUM CHLORIDE 0.9 % IV BOLUS (SEPSIS)
1000.0000 mL | Freq: Once | INTRAVENOUS | Status: AC
  Administered 2017-02-20: 1000 mL via INTRAVENOUS

## 2017-02-20 MED ORDER — ENOXAPARIN SODIUM 60 MG/0.6ML ~~LOC~~ SOLN
55.0000 mg | SUBCUTANEOUS | Status: DC
  Administered 2017-02-20 – 2017-02-21 (×2): 55 mg via SUBCUTANEOUS
  Filled 2017-02-20 (×2): qty 0.6

## 2017-02-20 MED ORDER — GLIMEPIRIDE 4 MG PO TABS
4.0000 mg | ORAL_TABLET | Freq: Every day | ORAL | Status: DC
  Administered 2017-02-20 – 2017-02-21 (×2): 4 mg via ORAL
  Filled 2017-02-20 (×2): qty 1

## 2017-02-20 MED ORDER — BUPROPION HCL ER (XL) 300 MG PO TB24
300.0000 mg | ORAL_TABLET | Freq: Every day | ORAL | Status: DC
  Administered 2017-02-20 – 2017-02-21 (×2): 300 mg via ORAL
  Filled 2017-02-20 (×2): qty 1

## 2017-02-20 MED ORDER — SODIUM CHLORIDE 0.9 % IV SOLN
3.0000 g | Freq: Four times a day (QID) | INTRAVENOUS | Status: DC
  Administered 2017-02-20 – 2017-02-21 (×5): 3 g via INTRAVENOUS
  Filled 2017-02-20 (×6): qty 3

## 2017-02-20 MED ORDER — ONDANSETRON HCL 4 MG PO TABS: 4.0000 mg | ORAL_TABLET | Freq: Four times a day (QID) | ORAL | Status: DC | PRN

## 2017-02-20 MED ORDER — TRAZODONE HCL 100 MG PO TABS
100.0000 mg | ORAL_TABLET | Freq: Every evening | ORAL | Status: DC | PRN
  Administered 2017-02-20: 100 mg via ORAL
  Filled 2017-02-20: qty 1

## 2017-02-20 MED ORDER — INSULIN ASPART 100 UNIT/ML ~~LOC~~ SOLN: 0.0000 [IU] | Freq: Three times a day (TID) | SUBCUTANEOUS | Status: DC

## 2017-02-20 MED ORDER — LORATADINE 10 MG PO TABS
10.0000 mg | ORAL_TABLET | Freq: Every day | ORAL | Status: DC
  Administered 2017-02-20 – 2017-02-21 (×2): 10 mg via ORAL
  Filled 2017-02-20 (×2): qty 1

## 2017-02-20 MED ORDER — TRAMADOL HCL 50 MG PO TABS
100.0000 mg | ORAL_TABLET | Freq: Four times a day (QID) | ORAL | Status: DC | PRN
  Administered 2017-02-20: 100 mg via ORAL
  Filled 2017-02-20: qty 2

## 2017-02-20 MED ORDER — IBUPROFEN 200 MG PO TABS
600.0000 mg | ORAL_TABLET | Freq: Three times a day (TID) | ORAL | Status: DC
  Administered 2017-02-20 – 2017-02-21 (×2): 600 mg via ORAL
  Filled 2017-02-20 (×2): qty 3

## 2017-02-20 MED ORDER — SENNA 8.6 MG PO TABS
1.0000 | ORAL_TABLET | Freq: Two times a day (BID) | ORAL | Status: DC
  Administered 2017-02-20: 8.6 mg via ORAL
  Filled 2017-02-20: qty 1

## 2017-02-20 MED ORDER — ROSUVASTATIN CALCIUM 10 MG PO TABS
20.0000 mg | ORAL_TABLET | Freq: Every day | ORAL | Status: DC
  Administered 2017-02-20 – 2017-02-21 (×2): 20 mg via ORAL
  Filled 2017-02-20 (×2): qty 2

## 2017-02-20 MED ORDER — ONDANSETRON HCL 4 MG/2ML IJ SOLN
4.0000 mg | Freq: Once | INTRAMUSCULAR | Status: AC
  Administered 2017-02-20: 4 mg via INTRAVENOUS
  Filled 2017-02-20: qty 2

## 2017-02-20 MED ORDER — SODIUM CHLORIDE 0.9 % IV SOLN
3.0000 g | Freq: Once | INTRAVENOUS | Status: AC
  Administered 2017-02-20: 3 g via INTRAVENOUS
  Filled 2017-02-20: qty 3

## 2017-02-20 MED ORDER — SODIUM CHLORIDE 0.9 % IV SOLN
INTRAVENOUS | Status: DC
  Administered 2017-02-20 – 2017-02-21 (×3): via INTRAVENOUS

## 2017-02-20 MED ORDER — MONTELUKAST SODIUM 10 MG PO TABS
10.0000 mg | ORAL_TABLET | Freq: Every day | ORAL | Status: DC
  Administered 2017-02-20 – 2017-02-21 (×2): 10 mg via ORAL
  Filled 2017-02-20 (×2): qty 1

## 2017-02-20 MED ORDER — CANAGLIFLOZIN 300 MG PO TABS
300.0000 mg | ORAL_TABLET | Freq: Every day | ORAL | Status: DC
  Administered 2017-02-20: 300 mg via ORAL
  Filled 2017-02-20 (×2): qty 1

## 2017-02-20 NOTE — Progress Notes (Signed)
PHARMACY - LOVENOX   Patient ordered Lovenox  sq q24h for VTE prophylaxis and pharmacy requested to adjust dose as needed.  Height: 61 inches Weight: 115.7 kg BMI = 48 CrCl > 120 ml/min  Assessment:  BMI > 30 and CrCl > 30 ml/min; therefore Lovenox should be adjusted to 0.5 mg/kg/q24h per manufacturer's recommendation.  PLAN: Adjust Lovenox to  (0.5mg /kg) sq q24h  Terrilee Files, PharmD

## 2017-02-20 NOTE — H&P (Signed)
History and Physical    Danett Palazzo RUE:[id number] DOB: [birth year] DOA: 02/20/2017  PCP: Myrlene Broker, MD  Patient coming from: Home   I have personally briefly reviewed patient's old medical records in [facility]  Chief Complaint: worsening right hand swelling   HPI: Rita Hogan is a 31 y.o. female with medical history significant of asthma, HTN, Morbid obesity, DM who presents complaining of worsening right hand swelling and redness. Patient was seen in the day prior to admission for cat bite. She received first series of rabies vaccine and IV antibiotics, and discharge home on Augmentin. She was bitten by stray cat on 4-12. She notice subsequently redness and swelling of her hand. She denies fevers, chills, nausea or vomiting. She presents today because she notice worsening of right hand swelling, and spreading redness to her arm.   ED Course: Patient received IV antibiotics, WBC 11, Sodium 133, bicarb 17 , CBG 168.   Review of Systems: As per HPI otherwise 10 point review of systems negative.    Past Medical History:  Diagnosis Date  . Allergy   . Asthma   . Depression   . Diabetes mellitus   . Heart murmur   . Hyperlipidemia   . Hypertension   . Morbid obesity (HCC)     Past Surgical History:  Procedure Laterality Date  . EYE SURGERY    . PREPATELLAR BURSA EXCISION     right     reports that she quit smoking about 4 years ago. She has quit using smokeless tobacco. She reports that she drinks alcohol. She reports that she does not use drugs.  Allergies  Allergen Reactions  . Metformin And Related Diarrhea and Nausea And Vomiting    Pt requests that adverse reaction be reported for high doses of Metformin  . Other     Walnuts  . Oxycodone     Trouble breathing    Family History  Problem Relation Age of Onset  . Adopted: Yes     Prior to Admission medications   Medication Sig Start Date End Date Taking? Authorizing Provider    amoxicillin-clavulanate (AUGMENTIN) 875-125 MG tablet Take 1 tablet by mouth every 12 (twelve) hours. 02/19/17  Yes Jeffrey Hedges, PA-C  benazepril (LOTENSIN) 40 MG tablet Take 1 tablet (40 mg total) by mouth daily. 04/15/16  Yes Myrlene Broker, MD  buPROPion (WELLBUTRIN XL) 300 MG 24 hr tablet TAKE 1 TABLET(300 MG) BY MOUTH DAILY 10/29/16  Yes Myrlene Broker, MD  canagliflozin [facility]) 300 MG TABS tablet Take 1 tablet (300 mg total) by mouth daily before breakfast. Reported on 12/24/2015 04/15/16  Yes Myrlene Broker, MD  cetirizine (ZYRTEC) 10 MG tablet Take 1 tablet (10 mg total) by mouth daily. 08/04/16  Yes Myrlene Broker, MD  citalopram (CELEXA) 40 MG tablet TAKE 1 TABLET(40 MG) BY MOUTH DAILY 10/29/16  Yes Myrlene Broker, MD  glimepiride (AMARYL) 4 MG tablet Take 1 tablet (4 mg total) by mouth daily with breakfast. 04/15/16  Yes Myrlene Broker, MD  hydrOXYzine (VISTARIL) 25 MG capsule TAKE 2 CAPSULES(50 MG) BY MOUTH EVERY 6 HOURS AS NEEDED FOR ANXIETY 01/14/17  Yes Myrlene Broker, MD  montelukast (SINGULAIR) 10 MG tablet Take 1 tablet (10 mg total) by mouth daily. 04/15/16  Yes Myrlene Broker, MD  rosuvastatin (CRESTOR) 20 MG tablet Take 1 tablet (20 mg total) by mouth daily. Reported on 12/24/2015 04/15/16  Yes Myrlene Broker, MD  traMADol [facility]) 50  MG tablet Take 1 tablet (50 mg total) by mouth every 6 (six) hours as needed. 02/19/17  Yes Eyvonne Mechanic, PA-C  traZODone (DESYREL) 100 MG tablet Take 1 tablet (100 mg total) by mouth at bedtime as needed for sleep. 10/21/16  Yes Myrlene Broker, MD  triamcinolone cream (KENALOG) 0.1 % Apply 1 application topically 2 (two) times daily. 10/21/16  Yes Myrlene Broker, MD  VICTOZA 18 MG/3ML SOPN ADMINISTER 1.8 MG UNDER THE SKIN EVERY DAY 01/14/17  Yes Myrlene Broker, MD  fluconazole (DIFLUCAN) 150 MG tablet Take 1 tablet (150 mg total) by mouth every 3 (three) days. Patient not taking:  Reported on 02/20/2017 07/21/16   Myrlene Broker, MD  glucose blood (ACCU-CHEK AVIVA PLUS) test strip 1 each by Other route 2 (two) times daily. Use to check blood sugar twice a day. Dx E11.9 08/27/16   Myrlene Broker, MD  Insulin Pen Needle (SURE COMFORT PEN NEEDLES) 30G X 8 MM MISC Use for injections 08/04/16   Myrlene Broker, MD    Physical Exam: Vitals:   02/20/17 0553 02/20/17 0829  BP: (!) 156/96 (!) 136/92  Pulse: (!) 105 91  Resp: 20 16  Temp: 97.9 F (36.6 C)   TempSrc: Oral   SpO2: 96% 100%    Constitutional: NAD, calm, comfortable, morbid obese Vitals:   02/20/17 0553 02/20/17 0829  BP: (!) 156/96 (!) 136/92  Pulse: (!) 105 91  Resp: 20 16  Temp: 97.9 F (36.6 C)   TempSrc: Oral   SpO2: 96% 100%   Eyes: PERRL, lids and conjunctivae normal ENMT: Mucous membranes are moist. Posterior pharynx clear of any exudate or lesions.Normal dentition.  Neck: normal, supple, no masses, no thyromegaly Respiratory: clear to auscultation bilaterally, no wheezing, no crackles. Normal respiratory effort. No accessory muscle use.  Cardiovascular: Regular rate and rhythm, no murmurs / rubs / gallops. No extremity edema. 2+ pedal pulses. No carotid bruits.  Abdomen: no tenderness, no masses palpated. No hepatosplenomegaly. Bowel sounds positive.  Musculoskeletal: no clubbing / cyanosis. . Good ROM, no contractures. Normal muscle tone.  Skin: Right had with redness, around thumb and palmar and dorsal area. Redness  Tracking to arm.  Neurologic: CN 2-12 grossly intact. Sensation intact, DTR normal. Strength 5/5 in all 4.  Psychiatric: Normal judgment and insight. Alert and oriented x 3. Normal mood.    Labs on Admission: I have personally reviewed following labs and imaging studies  CBC:  Recent Labs Lab 02/20/17 0618  WBC 11.3*  NEUTROABS 7.1  HGB 14.1  HCT 39.4  MCV 82.6  PLT 320   Basic Metabolic Panel:  Recent Labs Lab 02/20/17 0618  NA 133*  K 4.8   CL 103  CO2 17*  GLUCOSE 168*  BUN 9  CREATININE 0.41*  CALCIUM 8.7*   GFR: Estimated Creatinine Clearance: 121.7 mL/min (A) (by C-G formula based on SCr of 0.41 mg/dL (L)). Liver Function Tests: No results for input(s): AST, ALT, ALKPHOS, BILITOT, PROT, ALBUMIN in the last 168 hours. No results for input(s): LIPASE, AMYLASE in the last 168 hours. No results for input(s): AMMONIA in the last 168 hours. Coagulation Profile: No results for input(s): INR, PROTIME in the last 168 hours. Cardiac Enzymes: No results for input(s): CKTOTAL, CKMB, CKMBINDEX, TROPONINI in the last 168 hours. BNP (last 3 results) No results for input(s): PROBNP in the last 8760 hours. HbA1C: No results for input(s): HGBA1C in the last 72 hours. CBG:  Recent Labs Lab 02/19/17  1717  GLUCAP 266*   Lipid Profile: No results for input(s): CHOL, HDL, LDLCALC, TRIG, CHOLHDL, LDLDIRECT in the last 72 hours. Thyroid Function Tests: No results for input(s): TSH, T4TOTAL, FREET4, T3FREE, THYROIDAB in the last 72 hours. Anemia Panel: No results for input(s): VITAMINB12, FOLATE, FERRITIN, TIBC, IRON, RETICCTPCT in the last 72 hours. Urine analysis:    Component Value Date/Time   COLORURINE YELLOW 04/27/2015 0601   APPEARANCEUR CLOUDY (A) 04/27/2015 0601   LABSPEC >1.030 (H) 04/27/2015 0601   PHURINE 5.5 04/27/2015 0601   GLUCOSEU 500 (A) 04/27/2015 0601   HGBUR NEGATIVE 04/27/2015 0601   BILIRUBINUR SMALL (A) 04/27/2015 0601   KETONESUR >80 (A) 04/27/2015 0601   PROTEINUR 30 (A) 04/27/2015 0601   UROBILINOGEN 0.2 04/27/2015 0601   NITRITE NEGATIVE 04/27/2015 0601   LEUKOCYTESUR NEGATIVE 04/27/2015 0601    Radiological Exams on Admission: Dg Chest 2 View  Result Date: 02/19/2017 CLINICAL DATA:  Shortness of breath and wheezing, chest discomfort. History of diabetes and hypertension. EXAM: CHEST  2 VIEW COMPARISON:  Chest radiograph January 16, 2014 FINDINGS: Cardiomediastinal silhouette is normal. No  pleural effusions or focal consolidations. Trachea projects midline and there is no pneumothorax. Soft tissue planes and included osseous structures are non-suspicious. Large body habitus. IMPRESSION: Normal chest. Electronically Signed   By: Awilda Metro M.D.   On: 02/19/2017 15:55   Dg Hand Complete Right  Result Date: 02/20/2017 CLINICAL DATA:  Right hand swelling after cat bite. EXAM: RIGHT HAND - COMPLETE 3+ VIEW COMPARISON:  Hand radiograph 02/19/2017 FINDINGS: There is marked soft tissue swelling between the first and second metacarpals. No soft tissue emphysema. No osseous abnormality. IMPRESSION: Soft-tissue swelling of the area between the right first and second metacarpals without osseous abnormality. Electronically Signed   By: Deatra Robinson M.D.   On: 02/20/2017 06:45   Dg Hand Complete Right  Result Date: 02/19/2017 CLINICAL DATA:  Moderate gradually worsening pain and swelling after bit by cat. EXAM: RIGHT HAND - COMPLETE 3+ VIEW COMPARISON:  None. FINDINGS: There is no evidence of fracture or dislocation. There is no evidence of arthropathy or other focal bone abnormality. Soft tissues are unremarkable. IMPRESSION: Negative. Electronically Signed   By: Awilda Metro M.D.   On: 02/19/2017 15:57      Assessment/Plan Active Problems:   Type 2 diabetes mellitus (HCC)   Essential hypertension   Morbid obesity (HCC)   Major depression, recurrent (HCC)   Cat bite   Bite wound of right hand  1-Right hand wound, after cat bite. Infected.  Admit to med-surgery.  IV fluids. IV antibiotics.  Hand sx consulted. Npo after midnight.  Patient received first series of rabies.  2-DM;  Continue with Amaryl, Invokana.  SSI.   3- Depression; continue with home meds, trazodone, Celexa, Wellbutrin.   DVT prophylaxis: lovenox Code Status: Full code.  Family Communication: care discussed with patient  Disposition Plan: home when stable.  Consults called: Hand sx called by ED   Admission status: med-surgery, observation.   Alba Cory MD [facility] Pager [phone]  If 7PM-7AM, please contact night-coverage [email or web address] Password [facility]  02/20/2017, 9:14 AM

## 2017-02-20 NOTE — Consult Note (Signed)
Reason for Consult:infection R hand Referring Physician: ER  CC:I was bitten by a cat  HPI:  Rita Hogan is an 31 y.o. right handed female who presents with pain and swelling and redness spreading up arm.  Pt was bitten by a cat ~1 day ago, seen in ER, placed on abx, returned with worsening signs and symptoms of infection       .   Pain is rated at    7/10 and is described as shar.  Pain is constant.  Pain is made better by rest/immobilization, worse with motion.   Associated signs/symptoms:increasing redness up arm Previous treatment:    Past Medical History:  Diagnosis Date  . Allergy   . Anxiety   . Asthma   . Depression   . Diabetes mellitus   . Heart murmur   . Hyperlipidemia   . Hypertension   . Morbid obesity ([place])     Past Surgical History:  Procedure Laterality Date  . EYE SURGERY    . PREPATELLAR BURSA EXCISION     right    Family History  Problem Relation Age of Onset  . Adopted: Yes    Social History:  reports that she quit smoking about 4 years ago. She has quit using smokeless tobacco. She reports that she drinks alcohol. She reports that she does not use drugs.  Allergies:  Allergies  Allergen Reactions  . Metformin And Related Diarrhea and Nausea And Vomiting    Pt requests that adverse reaction be reported for high doses of Metformin  . Other     Walnuts  . Oxycodone     Trouble breathing    Medications: I have reviewed the patient's current medications.  Results for orders placed or performed during the hospital encounter of 02/20/17 (from the past 48 hour(s))  Basic metabolic panel     Status: Abnormal   Collection Time: 02/20/17  6:18 AM  Result Value Ref Range   Sodium 133 (L) 135 - 145 mmol/L   Potassium 4.8 3.5 - 5.1 mmol/L   Chloride 103 101 - 111 mmol/L   CO2 17 (L) 22 - 32 mmol/L   Glucose, Bld 168 (H) 65 - 99 mg/dL   BUN 9 6 - 20 mg/dL   Creatinine, Ser 0.41 (L) 0.44 - 1.00 mg/dL   Calcium 8.7 (L) 8.9 - 10.3 mg/dL   GFR calc  non Af Amer >60 >60 mL/min   GFR calc Af Amer >60 >60 mL/min    Comment: (NOTE) The eGFR has been calculated using the CKD EPI equation. This calculation has not been validated in all clinical situations. eGFR's persistently <60 mL/min signify possible Chronic Kidney Disease.    Anion gap 13 5 - 15  CBC with Differential     Status: Abnormal   Collection Time: 02/20/17  6:18 AM  Result Value Ref Range   WBC 11.3 (H) 4.0 - 10.5 K/uL   RBC 4.77 3.87 - 5.11 MIL/uL   Hemoglobin 14.1 12.0 - 15.0 g/dL   HCT 39.4 36.0 - 46.0 %   MCV 82.6 78.0 - 100.0 fL   MCH 29.6 26.0 - 34.0 pg   MCHC 35.8 30.0 - 36.0 g/dL   RDW 12.3 11.5 - 15.5 %   Platelets 320 150 - 400 K/uL   Neutrophils Relative % 63 %   Neutro Abs 7.1 1.7 - 7.7 K/uL   Lymphocytes Relative 27 %   Lymphs Abs 3.0 0.7 - 4.0 K/uL   Monocytes Relative 9 %  Monocytes Absolute 1.0 0.1 - 1.0 K/uL   Eosinophils Relative 1 %   Eosinophils Absolute 0.1 0.0 - 0.7 K/uL   Basophils Relative 0 %   Basophils Absolute 0.0 0.0 - 0.1 K/uL  CG4 I-STAT (Lactic acid)     Status: None   Collection Time: 02/20/17  6:54 AM  Result Value Ref Range   Lactic Acid, Venous 1.09 0.5 - 1.9 mmol/L  Glucose, capillary     Status: Abnormal   Collection Time: 02/20/17 10:57 AM  Result Value Ref Range   Glucose-Capillary 146 (H) 65 - 99 mg/dL   Comment 1 Notify RN    Comment 2 Document in Chart   Glucose, capillary     Status: None   Collection Time: 02/20/17  5:22 PM  Result Value Ref Range   Glucose-Capillary 89 65 - 99 mg/dL    Dg Chest 2 View  Result Date: 02/19/2017 CLINICAL DATA:  Shortness of breath and wheezing, chest discomfort. History of diabetes and hypertension. EXAM: CHEST  2 VIEW COMPARISON:  Chest radiograph January 16, 2014 FINDINGS: Cardiomediastinal silhouette is normal. No pleural effusions or focal consolidations. Trachea projects midline and there is no pneumothorax. Soft tissue planes and included osseous structures are  non-suspicious. Large body habitus. IMPRESSION: Normal chest. Electronically Signed   By: Elon Alas M.D.   On: 02/19/2017 15:55   Dg Hand Complete Right  Result Date: 02/20/2017 CLINICAL DATA:  Right hand swelling after cat bite. EXAM: RIGHT HAND - COMPLETE 3+ VIEW COMPARISON:  Hand radiograph 02/19/2017 FINDINGS: There is marked soft tissue swelling between the first and second metacarpals. No soft tissue emphysema. No osseous abnormality. IMPRESSION: Soft-tissue swelling of the area between the right first and second metacarpals without osseous abnormality. Electronically Signed   By: Ulyses Jarred M.D.   On: 02/20/2017 06:45   Dg Hand Complete Right  Result Date: 02/19/2017 CLINICAL DATA:  Moderate gradually worsening pain and swelling after bit by cat. EXAM: RIGHT HAND - COMPLETE 3+ VIEW COMPARISON:  None. FINDINGS: There is no evidence of fracture or dislocation. There is no evidence of arthropathy or other focal bone abnormality. Soft tissues are unremarkable. IMPRESSION: Negative. Electronically Signed   By: Elon Alas M.D.   On: 02/19/2017 15:57    Pertinent items are noted in HPI. Temp:  [97.9 F (36.6 C)-98.9 F (37.2 C)] 98.9 F (37.2 C) (04/14 1422) Pulse Rate:  [91-105] 99 (04/14 1422) Resp:  [16-20] 18 (04/14 1018) BP: (120-156)/(76-96) 120/76 (04/14 1422) SpO2:  [96 %-100 %] 96 % (04/14 1422) Weight:  [115.7 kg (255 lb)] 115.7 kg (255 lb) (04/14 1018) General appearance: alert and cooperative Resp: clear to auscultation bilaterally Cardio: regularly irregular rhythm GI: soft, non-tender; bowel sounds normal; no masses,  no organomegaly Extremities: swelling /erythema mostly in 1-2 webspace with few cat bite marks; erythem to wrist area, no definite tenosynovitis of thumb or IF   Assessment: Cat Bite , infection, abscess R hand Plan: Seen this am, some better this afternoon on IV abx.  Bedside I&D performed. Small amount of turbid fluid expressed from one  of the bite marks, others superficial, irrigated with saline.  Will observe until am to see if infection clearing or may need extensive OR I&D. Cont IV abx for now.   Dennie Bible 02/20/2017, 7:56 PM

## 2017-02-20 NOTE — Progress Notes (Signed)
Pharmacy Antibiotic Note  Johnna Bollier is a 31 y.o. female admitted on 02/20/2017 with skin/soft tissue infection following cat bite from stray cat. Presented to ED 4/14 and given rabies vaccine, rabies IgG and zosyn x1 (sent home with Rx for amox/clavulanate).  Pharmacy has been consulted for ampicillin/sulbactam dosing.  Plan:  Ampicillin/sulbactam 3gm IV q6h  Do not anticipate need to renally adjust, so pharmacy will sign-off     Temp (24hrs), Avg:98 F (36.7 C), Min:97.9 F (36.6 C), Max:98.1 F (36.7 C)   Recent Labs Lab 02/20/17 0618  WBC 11.3*  CREATININE 0.41*    Estimated Creatinine Clearance: 121.7 mL/min (A) (by C-G formula based on SCr of 0.41 mg/dL (L)).    Allergies  Allergen Reactions  . Metformin And Related Diarrhea and Nausea And Vomiting    Pt requests that adverse reaction be reported for high doses of Metformin  . Other     Walnuts  . Oxycodone     Trouble breathing    Antimicrobials this admission: 4/14 amp/sulb >>  Dose adjustments this admission:  Microbiology results: 4/14 Bcx:   Thank you for allowing pharmacy to be a part of this patient's care.  Juliette Alcide, PharmD, BCPS.   Pager: [phone] 02/20/2017 8:36 AM

## 2017-02-20 NOTE — ED Triage Notes (Signed)
Pt returns reporting increased pain at animal bite site along with axillary swelling, right hand numbness, and rash to right arm.

## 2017-02-20 NOTE — ED Provider Notes (Signed)
[facility]-EMERGENCY DEPT Provider Note   CSN: [id number] Arrival date & time: 02/20/17  0548     History   Chief Complaint Chief Complaint  Patient presents with  . Animal Bite    HPI Rita Hogan is a 31 y.o. female.  HPI 31 year old female past medical history significant for diabetes, hypertension, asthma presents to the ED today with complaints of gradually worsening erythema, edema, pain to the right hand after sustaining a cat bite yesterday. Patient states that she was bitten by a stray cat. She was seen in the ED yesterday for same. At that time she was given IV Zosyn and discharged with Augmentin. Patient states that she's been taking the Augmentin and tramadol for pain but the redness and pain has worsened. States that the redness is now streaking up her arm. States she has been using ice and elevation but this does not seem to help. She denies any drainage from the area. Patient is receiving rabies vaccinations. She denies any fevers. Endorses worsening pain to the right hand with movement. Denies any nausea, vomiting, diarrhea. Patient also feels that her axilla swelling. She also feels like her right hand is intermittently numb. Past Medical History:  Diagnosis Date  . Allergy   . Asthma   . Depression   . Diabetes mellitus   . Heart murmur   . Hyperlipidemia   . Hypertension   . Morbid obesity [facility])     Patient Active Problem List   Diagnosis Date Noted  . Major depression, recurrent ([facility]) 07/21/2016  . Essential hypertension 12/24/2015  . Morbid obesity ([facility]) 12/24/2015  . Hidradenitis suppurativa 12/24/2015  . Rash and nonspecific skin eruption 12/24/2015  . Type 2 diabetes mellitus ([facility]) 07/09/2011    Past Surgical History:  Procedure Laterality Date  . EYE SURGERY    . PREPATELLAR BURSA EXCISION     right    OB History    Gravida Para Term Preterm AB Living   0             SAB TAB Ectopic Multiple Live Births                   Home  Medications    Prior to Admission medications   Medication Sig Start Date End Date Taking? Authorizing Provider  amoxicillin-clavulanate (AUGMENTIN) 875-125 MG tablet Take 1 tablet by mouth every 12 (twelve) hours. 02/19/17  Yes Jeffrey Hedges, PA-C  benazepril (LOTENSIN) 40 MG tablet Take 1 tablet (40 mg total) by mouth daily. 04/15/16  Yes Myrlene Broker, MD  buPROPion (WELLBUTRIN XL) 300 MG 24 hr tablet TAKE 1 TABLET(300 MG) BY MOUTH DAILY 10/29/16  Yes Myrlene Broker, MD  canagliflozin [facility]) 300 MG TABS tablet Take 1 tablet (300 mg total) by mouth daily before breakfast. Reported on 12/24/2015 04/15/16  Yes Myrlene Broker, MD  cetirizine (ZYRTEC) 10 MG tablet Take 1 tablet (10 mg total) by mouth daily. 08/04/16  Yes Myrlene Broker, MD  citalopram (CELEXA) 40 MG tablet TAKE 1 TABLET(40 MG) BY MOUTH DAILY 10/29/16  Yes Myrlene Broker, MD  glimepiride (AMARYL) 4 MG tablet Take 1 tablet (4 mg total) by mouth daily with breakfast. 04/15/16  Yes Myrlene Broker, MD  hydrOXYzine (VISTARIL) 25 MG capsule TAKE 2 CAPSULES(50 MG) BY MOUTH EVERY 6 HOURS AS NEEDED FOR ANXIETY 01/14/17  Yes Myrlene Broker, MD  montelukast (SINGULAIR) 10 MG tablet Take 1 tablet (10 mg total) by mouth daily. 04/15/16  Yes Lanora Manis  Alison Murray, MD  rosuvastatin (CRESTOR) 20 MG tablet Take 1 tablet (20 mg total) by mouth daily. Reported on 12/24/2015 04/15/16  Yes Myrlene Broker, MD  traMADol (ULTRAM) 50 MG tablet Take 1 tablet (50 mg total) by mouth every 6 (six) hours as needed. 02/19/17  Yes Eyvonne Mechanic, PA-C  traZODone (DESYREL) 100 MG tablet Take 1 tablet (100 mg total) by mouth at bedtime as needed for sleep. 10/21/16  Yes Myrlene Broker, MD  triamcinolone cream (KENALOG) 0.1 % Apply 1 application topically 2 (two) times daily. 10/21/16  Yes Myrlene Broker, MD  VICTOZA 18 MG/3ML SOPN ADMINISTER 1.8 MG UNDER THE SKIN EVERY DAY 01/14/17  Yes Myrlene Broker, MD    fluconazole (DIFLUCAN) 150 MG tablet Take 1 tablet (150 mg total) by mouth every 3 (three) days. Patient not taking: Reported on 02/20/2017 07/21/16   Myrlene Broker, MD  glucose blood (ACCU-CHEK AVIVA PLUS) test strip 1 each by Other route 2 (two) times daily. Use to check blood sugar twice a day. Dx E11.9 08/27/16   Myrlene Broker, MD  Insulin Pen Needle (SURE COMFORT PEN NEEDLES) 30G X 8 MM MISC Use for injections 08/04/16   Myrlene Broker, MD    Family History Family History  Problem Relation Age of Onset  . Adopted: Yes    Social History Social History  Substance Use Topics  . Smoking status: Former Smoker    Quit date: 07/13/2012  . Smokeless tobacco: Former Neurosurgeon  . Alcohol use Yes     Comment: rare     Allergies   Metformin and related; Other; and Oxycodone   Review of Systems Review of Systems  Constitutional: Negative for chills and fever.  HENT: Negative for congestion.   Eyes: Negative for visual disturbance.  Respiratory: Negative for cough and shortness of breath.   Cardiovascular: Negative for chest pain.  Gastrointestinal: Negative for abdominal pain, diarrhea, nausea and vomiting.  Genitourinary: Negative for dysuria, flank pain, frequency, hematuria and urgency.  Skin: Positive for wound.  Neurological: Positive for numbness. Negative for dizziness, weakness, light-headedness and headaches.     Physical Exam Updated Vital Signs BP (!) 156/96 (BP Location: Left Arm)   Pulse (!) 105   Temp 97.9 F (36.6 C) (Oral)   Resp 20   LMP 01/29/2017   SpO2 96%   Physical Exam  Constitutional: She is oriented to person, place, and time. She appears well-developed and well-nourished. No distress.  HENT:  Head: Normocephalic and atraumatic.  Eyes: Right eye exhibits no discharge. Left eye exhibits no discharge. No scleral icterus.  Neck: Normal range of motion. Neck supple.  Pulmonary/Chest: No respiratory distress.  Musculoskeletal: Normal  range of motion.  Swelling noted to the right hand with numerous puncture marks noted along the interdigit space of the first and second digit. Significant redness noted. No purulent drainage.. No discharge or fluctuance. Patient holding fingers slightly flexed position. Mild tenderness to flexion and extension of the first and second digit. Full range of motion of the right wrist. Erythematous streaking up the arm. Full range of motion the right elbow without any pain. Cap refill is normal. Radial pulses are 2+ bilaterally. Sensation is intact to sharp and dull. Unable to perform strength testing due to pain.  Neurological: She is alert and oriented to person, place, and time.  Skin: Skin is warm and dry. Capillary refill takes less than 2 seconds. Rash noted. No pallor.  Nursing note and vitals reviewed.  ED Treatments / Results  Labs (all labs ordered are listed, but only abnormal results are displayed) Labs Reviewed - No data to display  EKG  EKG Interpretation None       Radiology Dg Chest 2 View  Result Date: 02/19/2017 CLINICAL DATA:  Shortness of breath and wheezing, chest discomfort. History of diabetes and hypertension. EXAM: CHEST  2 VIEW COMPARISON:  Chest radiograph January 16, 2014 FINDINGS: Cardiomediastinal silhouette is normal. No pleural effusions or focal consolidations. Trachea projects midline and there is no pneumothorax. Soft tissue planes and included osseous structures are non-suspicious. Large body habitus. IMPRESSION: Normal chest. Electronically Signed   By: Awilda Metro M.D.   On: 02/19/2017 15:55   Dg Hand Complete Right  Result Date: 02/20/2017 CLINICAL DATA:  Right hand swelling after cat bite. EXAM: RIGHT HAND - COMPLETE 3+ VIEW COMPARISON:  Hand radiograph 02/19/2017 FINDINGS: There is marked soft tissue swelling between the first and second metacarpals. No soft tissue emphysema. No osseous abnormality. IMPRESSION: Soft-tissue swelling of  the area between the right first and second metacarpals without osseous abnormality. Electronically Signed   By: Deatra Robinson M.D.   On: 02/20/2017 06:45   Dg Hand Complete Right  Result Date: 02/19/2017 CLINICAL DATA:  Moderate gradually worsening pain and swelling after bit by cat. EXAM: RIGHT HAND - COMPLETE 3+ VIEW COMPARISON:  None. FINDINGS: There is no evidence of fracture or dislocation. There is no evidence of arthropathy or other focal bone abnormality. Soft tissues are unremarkable. IMPRESSION: Negative. Electronically Signed   By: Awilda Metro M.D.   On: 02/19/2017 15:57    Procedures Procedures (including critical care time)  Medications Ordered in ED Medications - No data to display   Initial Impression / Assessment and Plan / ED Course  I have reviewed the triage vital signs and the nursing notes.  Pertinent labs & imaging results that were available during my care of the patient were reviewed by me and considered in my medical decision making (see chart for details).     Patient presents to the ED for the second time for worsening infection following a cat bite. Patient has been on IV Zosyn yesterday along with oral Augmentin at home with little relief. Erythema and edema along with pain or worsening. Patient denies any fever, nausea, vomiting. On exam patient has streaking up the right arm on the worsening redness and decreased range of motion. Feel that infection is worsening. Will start patient on IV Unasyn. Basic labs were obtained. Repeat x-ray to assess for any subcutaneous gas. We'll consult hand surgery.  Mild leukocytosis note and normal lactic acid.All other labs unremarkable. Blood cultures obtained prior to the periodic initiation. X-ray shows no subcutaneous gas.   Spoke with Dr. Kristine Linea who recommends IV antibiotics and hospital admission with hand surgery consult. He does recommend unroofing the scabs. Spoke with Dr. Sunnie Nielsen with tried hospital medicine  who agrees to admit patient. Will see patient in the ED and placed admission orders. Currently patient is hemodynamically stable. Vital signs are normal. She was updated on plan of care and all questions were answered.  Pt seen and evaluated by Dr. Rubin Payor who is agreeable to the above plan.     Final Clinical Impressions(s) / ED Diagnoses   Final diagnoses:  Cat bite, initial encounter    New Prescriptions New Prescriptions   No medications on file     Rise Mu, PA-C 02/20/17 0841    Benjiman Core, MD 02/21/17 [phone]

## 2017-02-21 DIAGNOSIS — S61451A Open bite of right hand, initial encounter: Secondary | ICD-10-CM | POA: Diagnosis not present

## 2017-02-21 DIAGNOSIS — E1165 Type 2 diabetes mellitus with hyperglycemia: Secondary | ICD-10-CM

## 2017-02-21 DIAGNOSIS — W5501XA Bitten by cat, initial encounter: Secondary | ICD-10-CM

## 2017-02-21 LAB — BASIC METABOLIC PANEL
ANION GAP: 7 (ref 5–15)
BUN: 10 mg/dL (ref 6–20)
CALCIUM: 7.5 mg/dL — AB (ref 8.9–10.3)
CO2: 18 mmol/L — ABNORMAL LOW (ref 22–32)
CREATININE: 0.3 mg/dL — AB (ref 0.44–1.00)
Chloride: 114 mmol/L — ABNORMAL HIGH (ref 101–111)
GFR calc Af Amer: >60 mL/min (ref >60)
GLUCOSE: 60 mg/dL — AB (ref 65–99)
POTASSIUM: 3.1 mmol/L — AB (ref 3.5–5.1)
SODIUM: 139 mmol/L (ref 135–145)

## 2017-02-21 LAB — CBC
HCT: 33.7 % — ABNORMAL LOW (ref 36.0–46.0)
HEMOGLOBIN: 11.6 g/dL — AB (ref 12.0–15.0)
MCH: 29.1 pg (ref 26.0–34.0)
MCHC: 34.4 g/dL (ref 30.0–36.0)
MCV: 84.7 fL (ref 78.0–100.0)
Platelets: 269 10*3/uL (ref 150–400)
RBC: 3.98 MIL/uL (ref 3.87–5.11)
RDW: 12.5 % (ref 11.5–15.5)
WBC: 8.7 10*3/uL (ref 4.0–10.5)

## 2017-02-21 LAB — GLUCOSE, CAPILLARY
GLUCOSE-CAPILLARY: 84 mg/dL (ref 65–99)
GLUCOSE-CAPILLARY: 80 mg/dL (ref 65–99)

## 2017-02-21 MED ORDER — POTASSIUM CHLORIDE CRYS ER 20 MEQ PO TBCR
40.0000 meq | EXTENDED_RELEASE_TABLET | ORAL | Status: DC
  Administered 2017-02-21: 40 meq via ORAL
  Filled 2017-02-21 (×2): qty 2

## 2017-02-21 NOTE — Progress Notes (Signed)
Discussed with patient discharge instructions, she verbalized agreement and understanding.   Patient to go home in private vehicle with all belongings.   

## 2017-02-21 NOTE — Discharge Summary (Addendum)
Rita Hogan, is a 31 y.o. female  DOB 1986-01-13  MRN [id number].  Admission date:  02/20/2017  Admitting Physician  Alba Cory, MD  Discharge Date:  02/21/2017   Primary MD  Myrlene Broker, MD  Recommendations for primary care physician for things to follow:  - please check CBC, BMP during next vest. - consider replacingInvokana if patient remains non-gap acidosis - Patient  instructed to follow COPD discharge recommendation about rabies booster follow-up on day 3, 7 and 21(she already has discharge instruction from ED and reports she will follow_   Admission Diagnosis  Cat bite, initial encounter [W55.01XA] Cat bite [W55.01XA]   Discharge Diagnosis  Cat bite, initial encounter [W55.01XA] Cat bite [W55.01XA]    Active Problems:   Type 2 diabetes mellitus (HCC)   Essential hypertension   Morbid obesity (HCC)   Major depression, recurrent (HCC)   Cat bite   Bite wound of right hand      Past Medical History:  Diagnosis Date  . Allergy   . Anxiety   . Asthma   . Depression   . Diabetes mellitus   . Heart murmur   . Hyperlipidemia   . Hypertension   . Morbid obesity (HCC)     Past Surgical History:  Procedure Laterality Date  . EYE SURGERY    . PREPATELLAR BURSA EXCISION     right       History of present illness and  Hospital Course:     Kindly see H&P for history of present illness and admission details, please review complete Labs, Consult reports and Test reports for all details in brief  HPI  from the history and physical done on the day of admission 02/20/2017  HPI: Rita Hogan is a 31 y.o. female with medical history significant of asthma, HTN, Morbid obesity, DM who presents complaining of worsening right hand swelling and redness. Patient was seen in the day prior to admission for cat bite. She received first series of rabies vaccine and IV  antibiotics, and discharge home on Augmentin. She was bitten by stray cat on 4-12. She notice subsequently redness and swelling of her hand. She denies fevers, chills, nausea or vomiting. She presents today because she notice worsening of right hand swelling, and spreading redness to her arm.   ED Course: Patient received IV antibiotics, WBC 11, Sodium 133, bicarb 17 , CBG 168.    Hospital Course   Right hand abscess/infection from cat bite - Hand surgery consult greatly appreciated, bedside I&D performed by Dr. Izora Ribas on 4/14, he was treated with IV Unasyn, evaluated by Dr. Izora Ribas this a.m., erythema appears to be better, some nonpurulent drainage, still some tenderness and swelling, but at this point no indication for surgery, she was instructed to wash hand 3 times a day and manipulate 1 to promote drainage, acted to continue with Augmentin on discharge and to follow with his office this week. - She received rabies immunoglobulin and vaccine Friday 4/13 in ED given his history, and to  receive booster on day 3, 7 and 21  Diabetes mellitus - Resume home medication on discharge, repeat BMP during this visit, given bicarbonate during hospital stay of 18, which may be caused by invokana.  Hypokalemia - repleted before discharge.   Discharge Condition: Stable   Follow UP  Follow-up Information    Johnette Abraham, MD. Schedule an appointment as soon as possible for a visit in 1 week(s).   Specialty:  General Surgery Contact information: [address] [place] [address] [phone]             Discharge Instructions  and  Discharge Medications    Discharge Instructions    Discharge instructions    Complete by:  As directed    Follow with Primary MD Myrlene Broker, MD in 7 days   Get CBC, CMP, checked  by Primary MD next visit.    Activity: As tolerated    Disposition Home    Diet: Heart Healthy, carb modified , with feeding assistance and  aspiration precautions.   On your next visit with your primary care physician please Get Medicines reviewed and adjusted.   Please request your Prim.MD to go over all Hospital Tests and Procedure/Radiological results at the follow up, please get all Hospital records sent to your Prim MD by signing hospital release before you go home.   If you experience worsening of your admission symptoms, develop shortness of breath, life threatening emergency, suicidal or homicidal thoughts you must seek medical attention immediately by calling 911 or calling your MD immediately  if symptoms less severe.  You Must read complete instructions/literature along with all the possible adverse reactions/side effects for all the Medicines you take and that have been prescribed to you. Take any new Medicines after you have completely understood and accpet all the possible adverse reactions/side effects.   Do not drive, operating heavy machinery, perform activities at heights, swimming or participation in water activities or provide baby sitting services if your were admitted for syncope or siezures until you have seen by Primary MD or a Neurologist and advised to do so again.  Do not drive when taking Pain medications.    Do not take more than prescribed Pain, Sleep and Anxiety Medications  Special Instructions: If you have smoked or chewed Tobacco  in the last 2 yrs please stop smoking, stop any regular Alcohol  and or any Recreational drug use.  Wear Seat belts while driving.   Please note  You were cared for by a hospitalist during your hospital stay. If you have any questions about your discharge medications or the care you received while you were in the hospital after you are discharged, you can call the unit and asked to speak with the hospitalist on call if the hospitalist that took care of you is not available. Once you are discharged, your primary care physician will handle any further medical issues.  Please note that NO REFILLS for any discharge medications will be authorized once you are discharged, as it is imperative that you return to your primary care physician (or establish a relationship with a primary care physician if you do not have one) for your aftercare needs so that they can reassess your need for medications and monitor your lab values.   Increase activity slowly    Complete by:  As directed      Allergies as of 02/21/2017      Reactions   Metformin And Related Diarrhea, Nausea  And Vomiting   Pt requests that adverse reaction be reported for high doses of Metformin   Other    Walnuts   Oxycodone    Trouble breathing      Medication List    TAKE these medications   amoxicillin-clavulanate 875-125 MG tablet Commonly known as:  AUGMENTIN Take 1 tablet by mouth every 12 (twelve) hours.   benazepril 40 MG tablet Commonly known as:  LOTENSIN Take 1 tablet (40 mg total) by mouth daily.   buPROPion 300 MG 24 hr tablet Commonly known as:  WELLBUTRIN XL TAKE 1 TABLET(300 MG) BY MOUTH DAILY   canagliflozin 300 MG Tabs tablet Commonly known as:  INVOKANA Take 1 tablet (300 mg total) by mouth daily before breakfast. Reported on 12/24/2015   cetirizine 10 MG tablet Commonly known as:  ZYRTEC Take 1 tablet (10 mg total) by mouth daily.   citalopram 40 MG tablet Commonly known as:  CELEXA TAKE 1 TABLET(40 MG) BY MOUTH DAILY   fluconazole 150 MG tablet Commonly known as:  DIFLUCAN Take 1 tablet (150 mg total) by mouth every 3 (three) days.   glimepiride 4 MG tablet Commonly known as:  AMARYL Take 1 tablet (4 mg total) by mouth daily with breakfast.   glucose blood test strip Commonly known as:  ACCU-CHEK AVIVA PLUS 1 each by Other route 2 (two) times daily. Use to check blood sugar twice a day. Dx E11.9   hydrOXYzine 25 MG capsule Commonly known as:  VISTARIL TAKE 2 CAPSULES(50 MG) BY MOUTH EVERY 6 HOURS AS NEEDED FOR ANXIETY   Insulin Pen Needle 30G X 8 MM  Misc Commonly known as:  SURE COMFORT PEN NEEDLES Use for injections   montelukast 10 MG tablet Commonly known as:  SINGULAIR Take 1 tablet (10 mg total) by mouth daily.   rosuvastatin 20 MG tablet Commonly known as:  CRESTOR Take 1 tablet (20 mg total) by mouth daily. Reported on 12/24/2015   traMADol 50 MG tablet Commonly known as:  ULTRAM Take 1 tablet (50 mg total) by mouth every 6 (six) hours as needed.   traZODone 100 MG tablet Commonly known as:  DESYREL Take 1 tablet (100 mg total) by mouth at bedtime as needed for sleep.   triamcinolone cream 0.1 % Commonly known as:  KENALOG Apply 1 application topically 2 (two) times daily.   VICTOZA 18 MG/3ML Sopn Generic drug:  liraglutide ADMINISTER 1.8 MG UNDER THE SKIN EVERY DAY         Diet and Activity recommendation: See Discharge Instructions above   Consults obtained -  Hand Surgery   Major procedures and Radiology Reports - PLEASE review detailed and final reports for all details, in brief -     Dg Chest 2 View  Result Date: 02/19/2017 CLINICAL DATA:  Shortness of breath and wheezing, chest discomfort. History of diabetes and hypertension. EXAM: CHEST  2 VIEW COMPARISON:  Chest radiograph January 16, 2014 FINDINGS: Cardiomediastinal silhouette is normal. No pleural effusions or focal consolidations. Trachea projects midline and there is no pneumothorax. Soft tissue planes and included osseous structures are non-suspicious. Large body habitus. IMPRESSION: Normal chest. Electronically Signed   By: Awilda Metro M.D.   On: 02/19/2017 15:55   Dg Hand Complete Right  Result Date: 02/20/2017 CLINICAL DATA:  Right hand swelling after cat bite. EXAM: RIGHT HAND - COMPLETE 3+ VIEW COMPARISON:  Hand radiograph 02/19/2017 FINDINGS: There is marked soft tissue swelling between the first and second metacarpals. No soft tissue  emphysema. No osseous abnormality. IMPRESSION: Soft-tissue swelling of the area between the right  first and second metacarpals without osseous abnormality. Electronically Signed   By: Deatra Robinson M.D.   On: 02/20/2017 06:45   Dg Hand Complete Right  Result Date: 02/19/2017 CLINICAL DATA:  Moderate gradually worsening pain and swelling after bit by cat. EXAM: RIGHT HAND - COMPLETE 3+ VIEW COMPARISON:  None. FINDINGS: There is no evidence of fracture or dislocation. There is no evidence of arthropathy or other focal bone abnormality. Soft tissues are unremarkable. IMPRESSION: Negative. Electronically Signed   By: Awilda Metro M.D.   On: 02/19/2017 15:57    Micro Results    No results found for this or any previous visit (from the past 240 hour(s)).     Today   Subjective:   Rita Hogan today has no headache,no chest or abdominal pain, reports some abdominal pain, but reports significantly improved, feels much better wants to go home today.   Objective:   Blood pressure 131/65, pulse 77, temperature 98 F (36.7 C), temperature source Oral, resp. rate 16, height  (1.549 m), weight 115.7 kg (255 lb), last menstrual period 01/29/2017, SpO2 99 %.   Intake/Output Summary (Last 24 hours) at 02/21/17 1306 Last data filed at 02/21/17 1100  Gross per 24 hour  Intake             3600 ml  Output                0 ml  Net             3600 ml    Exam Awake Alert, Oriented x 3 Supple Neck,No JVD, Symmetrical Chest wall movement, Good air movement bilaterally, CTAB RRR,No Gallops,Rubs or new Murmurs, No Parasternal Heave +ve B.Sounds, Abd Soft, Non tender, No rebound -guarding or rigidity. No Cyanosis, Clubbing or edema, Right hand with incision wound with minimal nonpurulent drainage, mild swelling  Data Review   CBC w Diff: Lab Results  Component Value Date   WBC 8.7 02/21/2017   HGB 11.6 (L) 02/21/2017   HCT 33.7 (L) 02/21/2017   PLT 269 02/21/2017   LYMPHOPCT 27 02/20/2017   MONOPCT 9 02/20/2017   EOSPCT 1 02/20/2017   BASOPCT 0 02/20/2017    CMP: Lab  Results  Component Value Date   NA 139 02/21/2017   K 3.1 (L) 02/21/2017   CL 114 (H) 02/21/2017   CO2 18 (L) 02/21/2017   BUN 10 02/21/2017   CREATININE 0.30 (L) 02/21/2017   PROT 7.6 10/21/2016   ALBUMIN 4.3 10/21/2016   BILITOT 0.5 10/21/2016   ALKPHOS 87 10/21/2016   AST 15 10/21/2016   ALT 21 10/21/2016  .   Total Time in preparing paper work, data evaluation and todays exam - 35 minutes  Milica Gully M.D on 02/21/2017 at 1:06 PM  [facility]   Office  [phone]

## 2017-02-21 NOTE — Discharge Instructions (Signed)
Follow with Primary MD Myrlene Broker, MD in 7 days   Get CBC, CMP, checked  by Primary MD next visit.    Activity: As tolerated    Disposition Home    Diet: Heart Healthy, carb modified , with feeding assistance and aspiration precautions.   On your next visit with your primary care physician please Get Medicines reviewed and adjusted.   Please request your Prim.MD to go over all Hospital Tests and Procedure/Radiological results at the follow up, please get all Hospital records sent to your Prim MD by signing hospital release before you go home.   If you experience worsening of your admission symptoms, develop shortness of breath, life threatening emergency, suicidal or homicidal thoughts you must seek medical attention immediately by calling 911 or calling your MD immediately  if symptoms less severe.  You Must read complete instructions/literature along with all the possible adverse reactions/side effects for all the Medicines you take and that have been prescribed to you. Take any new Medicines after you have completely understood and accpet all the possible adverse reactions/side effects.   Do not drive, operating heavy machinery, perform activities at heights, swimming or participation in water activities or provide baby sitting services if your were admitted for syncope or siezures until you have seen by Primary MD or a Neurologist and advised to do so again.  Do not drive when taking Pain medications.    Do not take more than prescribed Pain, Sleep and Anxiety Medications  Special Instructions: If you have smoked or chewed Tobacco  in the last 2 yrs please stop smoking, stop any regular Alcohol  and or any Recreational drug use.  Wear Seat belts while driving.   Please note  You were cared for by a hospitalist during your hospital stay. If you have any questions about your discharge medications or the care you received while you were in the hospital after you are  discharged, you can call the unit and asked to speak with the hospitalist on call if the hospitalist that took care of you is not available. Once you are discharged, your primary care physician will handle any further medical issues. Please note that NO REFILLS for any discharge medications will be authorized once you are discharged, as it is imperative that you return to your primary care physician (or establish a relationship with a primary care physician if you do not have one) for your aftercare needs so that they can reassess your need for medications and monitor your lab values.

## 2017-02-21 NOTE — Progress Notes (Signed)
S:hand sore  O:Blood pressure 131/65, pulse 77, temperature 98 F (36.7 C), temperature source Oral, resp. rate 16, height  (1.549 m), weight 115.7 kg (255 lb), last menstrual period 01/29/2017, SpO2 99 %.  Dressing changed, some non purulent drainage, erythema better, still some tenderness and swelling  A:s/p I&D hand   P:pt instructed to wash hand tid and manipulate wound to promote drainage, restart Augmentin, f/u in office this week

## 2017-02-22 ENCOUNTER — Emergency Department (HOSPITAL_COMMUNITY)
Admission: EM | Admit: 2017-02-22 | Discharge: 2017-02-22 | Disposition: A | Discharge status: Home / Self Care | Payer: BLUE CROSS/BLUE SHIELD | Attending: Emergency Medicine | Admitting: Emergency Medicine

## 2017-02-22 ENCOUNTER — Ambulatory Visit: Payer: BLUE CROSS/BLUE SHIELD | Admitting: Internal Medicine

## 2017-02-22 ENCOUNTER — Encounter (HOSPITAL_COMMUNITY): Payer: Self-pay

## 2017-02-22 DIAGNOSIS — E119 Type 2 diabetes mellitus without complications: Secondary | ICD-10-CM | POA: Diagnosis not present

## 2017-02-22 DIAGNOSIS — I10 Essential (primary) hypertension: Secondary | ICD-10-CM | POA: Insufficient documentation

## 2017-02-22 DIAGNOSIS — Z7984 Long term (current) use of oral hypoglycemic drugs: Secondary | ICD-10-CM | POA: Insufficient documentation

## 2017-02-22 DIAGNOSIS — J45909 Unspecified asthma, uncomplicated: Secondary | ICD-10-CM | POA: Diagnosis not present

## 2017-02-22 DIAGNOSIS — Z23 Encounter for immunization: Secondary | ICD-10-CM | POA: Diagnosis not present

## 2017-02-22 DIAGNOSIS — Z203 Contact with and (suspected) exposure to rabies: Secondary | ICD-10-CM | POA: Diagnosis not present

## 2017-02-22 DIAGNOSIS — Z87891 Personal history of nicotine dependence: Secondary | ICD-10-CM | POA: Insufficient documentation

## 2017-02-22 DIAGNOSIS — Z79899 Other long term (current) drug therapy: Secondary | ICD-10-CM | POA: Diagnosis not present

## 2017-02-22 MED ORDER — RABIES VACCINE, PCEC IM SUSR
1.0000 mL | Freq: Once | INTRAMUSCULAR | Status: AC
  Administered 2017-02-22: 1 mL via INTRAMUSCULAR
  Filled 2017-02-22: qty 1

## 2017-02-22 NOTE — ED Triage Notes (Signed)
PT HERE FOR DAY 2 OF HER RABIES INJECTION. PT STS HER PAIN NOW IS 3/10.

## 2017-02-22 NOTE — ED Provider Notes (Signed)
[facility]-EMERGENCY DEPT Provider Note   CSN: [id number] Arrival date & time: 02/22/17  1143   By signing my name below, I, Soijett Blue, attest that this documentation has been prepared under the direction and in the presence of Lyndel Safe, PA-C Electronically Signed: Soijett Blue, ED Scribe. 02/22/17. 12:48 PM.  History   Chief Complaint Chief Complaint  Patient presents with  . Rabies Injection    DAY 2    HPI Rita Hogan is a 31 y.o. female with a PMHx of DM, HTN, who presents to the Emergency Department complaining of the need for day 3 rabies vaccination today. Pt reports associated right fingers swelling. Pt has tried augmentin with relief of her symptoms. She states she was seen in the ED on 02/20/2017 s/p stray cat bite to right hand with rabies vaccination and immunoglobulin initiated then. Pt reports that she was evaluated by the hand surgeon this morning who reports that the affected area is healing appropriately. She denies fever, chills, right hand pain, color change, and any other symptoms.      The history is provided by the patient. No language interpreter was used.    Past Medical History:  Diagnosis Date  . Allergy   . Anxiety   . Asthma   . Depression   . Diabetes mellitus   . Heart murmur   . Hyperlipidemia   . Hypertension   . Morbid obesity [facility])     Patient Active Problem List   Diagnosis Date Noted  . Cat bite 02/20/2017  . Bite wound of right hand 02/20/2017  . Major depression, recurrent (HCC) 07/21/2016  . Essential hypertension 12/24/2015  . Morbid obesity (HCC) 12/24/2015  . Hidradenitis suppurativa 12/24/2015  . Rash and nonspecific skin eruption 12/24/2015  . Type 2 diabetes mellitus (HCC) 07/09/2011    Past Surgical History:  Procedure Laterality Date  . EYE SURGERY    . PREPATELLAR BURSA EXCISION     right    OB History    Gravida Para Term Preterm AB Living   0             SAB TAB Ectopic Multiple Live Births               Home Medications    Prior to Admission medications   Medication Sig Start Date End Date Taking? Authorizing Provider  amoxicillin-clavulanate (AUGMENTIN) 875-125 MG tablet Take 1 tablet by mouth every 12 (twelve) hours. 02/19/17   Eyvonne Mechanic, PA-C  benazepril (LOTENSIN) 40 MG tablet Take 1 tablet (40 mg total) by mouth daily. 04/15/16   Myrlene Broker, MD  buPROPion (WELLBUTRIN XL) 300 MG 24 hr tablet TAKE 1 TABLET(300 MG) BY MOUTH DAILY 10/29/16   Myrlene Broker, MD  canagliflozin [facility]) 300 MG TABS tablet Take 1 tablet (300 mg total) by mouth daily before breakfast. Reported on 12/24/2015 04/15/16   Myrlene Broker, MD  cetirizine (ZYRTEC) 10 MG tablet Take 1 tablet (10 mg total) by mouth daily. 08/04/16   Myrlene Broker, MD  citalopram (CELEXA) 40 MG tablet TAKE 1 TABLET(40 MG) BY MOUTH DAILY 10/29/16   Myrlene Broker, MD  fluconazole (DIFLUCAN) 150 MG tablet Take 1 tablet (150 mg total) by mouth every 3 (three) days. Patient not taking: Reported on 02/20/2017 07/21/16   Myrlene Broker, MD  glimepiride (AMARYL) 4 MG tablet Take 1 tablet (4 mg total) by mouth daily with breakfast. 04/15/16   Myrlene Broker, MD  glucose blood (ACCU-CHEK AVIVA  PLUS) test strip 1 each by Other route 2 (two) times daily. Use to check blood sugar twice a day. Dx E11.9 08/27/16   Myrlene Broker, MD  hydrOXYzine (VISTARIL) 25 MG capsule TAKE 2 CAPSULES(50 MG) BY MOUTH EVERY 6 HOURS AS NEEDED FOR ANXIETY 01/14/17   Myrlene Broker, MD  Insulin Pen Needle (SURE COMFORT PEN NEEDLES) 30G X 8 MM MISC Use for injections 08/04/16   Myrlene Broker, MD  montelukast (SINGULAIR) 10 MG tablet Take 1 tablet (10 mg total) by mouth daily. 04/15/16   Myrlene Broker, MD  rosuvastatin (CRESTOR) 20 MG tablet Take 1 tablet (20 mg total) by mouth daily. Reported on 12/24/2015 04/15/16   Myrlene Broker, MD  traMADol (ULTRAM) 50 MG tablet Take 1 tablet (50 mg total)  by mouth every 6 (six) hours as needed. 02/19/17   Eyvonne Mechanic, PA-C  traZODone (DESYREL) 100 MG tablet Take 1 tablet (100 mg total) by mouth at bedtime as needed for sleep. 10/21/16   Myrlene Broker, MD  triamcinolone cream (KENALOG) 0.1 % Apply 1 application topically 2 (two) times daily. 10/21/16   Myrlene Broker, MD  VICTOZA 18 MG/3ML SOPN ADMINISTER 1.8 MG UNDER THE SKIN EVERY DAY 01/14/17   Myrlene Broker, MD    Family History Family History  Problem Relation Age of Onset  . Adopted: Yes    Social History Social History  Substance Use Topics  . Smoking status: Former Smoker    Quit date: 07/13/2012  . Smokeless tobacco: Former Neurosurgeon  . Alcohol use Yes     Comment: rare     Allergies   Metformin and related; Other; and Oxycodone   Review of Systems Review of Systems  Constitutional: Negative for chills and fever.  Musculoskeletal: Positive for joint swelling (right fingers). Negative for arthralgias.  Skin: Negative for color change.     Physical Exam Updated Vital Signs BP 138/90 (BP Location: Left Arm)   Pulse 98   Temp 98.5 F (36.9 C) (Oral)   Resp 18   LMP 01/29/2017   SpO2 97%   Physical Exam  Constitutional: She appears well-developed and well-nourished. No distress.  HENT:  Head: Normocephalic and atraumatic.  Eyes: EOM are normal.  Neck: Neck supple.  Cardiovascular: Normal rate.   Pulmonary/Chest: Effort normal. No respiratory distress.  Abdominal: She exhibits no distension.  Musculoskeletal: Normal range of motion.  Skin: Skin is warm and dry.  Right hand has an intact dressing, that was not removed  Psychiatric: She has a normal mood and affect. Her behavior is normal.  Nursing note and vitals reviewed.    ED Treatments / Results  DIAGNOSTIC STUDIES: Oxygen Saturation is 97% on RA, nl by my interpretation.    COORDINATION OF CARE: 12:45 PM Discussed treatment plan with pt at bedside which includes rabies vaccination  and pt agreed to plan.   Procedures Procedures (including critical care time)  Medications Ordered in ED Medications  rabies vaccine (RABAVERT) injection 1 mL (1 mL Intramuscular Given 02/22/17 1301)     Initial Impression / Assessment and Plan / ED Course  I have reviewed the triage vital signs and the nursing notes.   [name] presented for the second dose (day three) of the rabies vaccine series.  She reports that this morning she went to the hand surgeon who viewed her wound, so dressing was not removed.  She did not have any complications or reactions to her previous rabies vaccine, is  still taking her antibiotics as directed.  Rabies vaccine was ordered, patient discharged in stable condition with instructions to return for remaining doses as directed.  Final Clinical Impressions(s) / ED Diagnoses   Final diagnoses:  Need for rabies vaccination    New Prescriptions Discharge Medication List as of 02/22/2017  1:36 PM     I personally performed the services described in this documentation, which was scribed in my presence. The recorded information has been reviewed and is accurate.   Cristina Gong, PA-C 02/24/17 1253    Bethann Berkshire, MD 03/02/17 2140

## 2017-02-25 LAB — CULTURE, BLOOD (ROUTINE X 2)
Culture: NO GROWTH
Culture: NO GROWTH
SPECIAL REQUESTS: ADEQUATE

## 2017-02-26 ENCOUNTER — Ambulatory Visit (INDEPENDENT_AMBULATORY_CARE_PROVIDER_SITE_OTHER): Payer: BLUE CROSS/BLUE SHIELD | Admitting: Internal Medicine

## 2017-02-26 ENCOUNTER — Other Ambulatory Visit (INDEPENDENT_AMBULATORY_CARE_PROVIDER_SITE_OTHER): Payer: BLUE CROSS/BLUE SHIELD

## 2017-02-26 ENCOUNTER — Encounter (HOSPITAL_COMMUNITY): Payer: Self-pay

## 2017-02-26 ENCOUNTER — Encounter: Payer: Self-pay | Admitting: Internal Medicine

## 2017-02-26 ENCOUNTER — Emergency Department (HOSPITAL_COMMUNITY)
Admission: EM | Admit: 2017-02-26 | Discharge: 2017-02-26 | Disposition: A | Discharge status: Home / Self Care | Payer: BLUE CROSS/BLUE SHIELD | Attending: Emergency Medicine | Admitting: Emergency Medicine

## 2017-02-26 VITALS — BP 150/96 | HR 106 | Temp 98.3°F | Resp 14 | Ht 61.0 in | Wt 259.0 lb

## 2017-02-26 DIAGNOSIS — Z794 Long term (current) use of insulin: Secondary | ICD-10-CM | POA: Insufficient documentation

## 2017-02-26 DIAGNOSIS — I10 Essential (primary) hypertension: Secondary | ICD-10-CM | POA: Diagnosis not present

## 2017-02-26 DIAGNOSIS — E1142 Type 2 diabetes mellitus with diabetic polyneuropathy: Secondary | ICD-10-CM | POA: Diagnosis not present

## 2017-02-26 DIAGNOSIS — J45909 Unspecified asthma, uncomplicated: Secondary | ICD-10-CM | POA: Diagnosis not present

## 2017-02-26 DIAGNOSIS — W5501XD Bitten by cat, subsequent encounter: Secondary | ICD-10-CM | POA: Diagnosis not present

## 2017-02-26 DIAGNOSIS — Z79899 Other long term (current) drug therapy: Secondary | ICD-10-CM | POA: Insufficient documentation

## 2017-02-26 DIAGNOSIS — S61451D Open bite of right hand, subsequent encounter: Secondary | ICD-10-CM | POA: Diagnosis not present

## 2017-02-26 DIAGNOSIS — Z87891 Personal history of nicotine dependence: Secondary | ICD-10-CM | POA: Diagnosis not present

## 2017-02-26 DIAGNOSIS — E1165 Type 2 diabetes mellitus with hyperglycemia: Secondary | ICD-10-CM

## 2017-02-26 DIAGNOSIS — Z203 Contact with and (suspected) exposure to rabies: Secondary | ICD-10-CM | POA: Insufficient documentation

## 2017-02-26 DIAGNOSIS — S61451A Open bite of right hand, initial encounter: Secondary | ICD-10-CM

## 2017-02-26 DIAGNOSIS — E119 Type 2 diabetes mellitus without complications: Secondary | ICD-10-CM | POA: Diagnosis not present

## 2017-02-26 DIAGNOSIS — IMO0002 Reserved for concepts with insufficient information to code with codable children: Secondary | ICD-10-CM

## 2017-02-26 DIAGNOSIS — W5501XA Bitten by cat, initial encounter: Secondary | ICD-10-CM | POA: Diagnosis not present

## 2017-02-26 LAB — BASIC METABOLIC PANEL
BUN: 12 mg/dL (ref 6–23)
CHLORIDE: 99 meq/L (ref 96–112)
CO2: 25 meq/L (ref 19–32)
CREATININE: 0.6 mg/dL (ref 0.40–1.20)
Calcium: 9.8 mg/dL (ref 8.4–10.5)
GFR: 124.71 mL/min (ref >60.00)
Glucose, Bld: 424 mg/dL — ABNORMAL HIGH (ref 70–99)
Potassium: 4.2 mEq/L (ref 3.5–5.1)
SODIUM: 134 meq/L — AB (ref 135–145)

## 2017-02-26 LAB — HEMOGLOBIN A1C: HEMOGLOBIN A1C: 9.2 % — AB (ref 4.6–6.5)

## 2017-02-26 MED ORDER — CITALOPRAM HYDROBROMIDE 40 MG PO TABS: ORAL_TABLET | ORAL | 3 refills | Status: AC

## 2017-02-26 MED ORDER — GLIMEPIRIDE 4 MG PO TABS: 4.0000 mg | ORAL_TABLET | Freq: Every day | ORAL | 6 refills | Status: DC

## 2017-02-26 MED ORDER — TRAZODONE HCL 50 MG PO TABS: 75.0000 mg | ORAL_TABLET | Freq: Every evening | ORAL | 11 refills | Status: DC | PRN

## 2017-02-26 MED ORDER — BUPROPION HCL ER (XL) 300 MG PO TB24: ORAL_TABLET | ORAL | 3 refills | Status: AC

## 2017-02-26 MED ORDER — CANAGLIFLOZIN 300 MG PO TABS: 300.0000 mg | ORAL_TABLET | Freq: Every day | ORAL | 6 refills | Status: DC

## 2017-02-26 MED ORDER — RABIES VACCINE, PCEC IM SUSR
1.0000 mL | Freq: Once | INTRAMUSCULAR | Status: AC
  Administered 2017-02-26: 1 mL via INTRAMUSCULAR
  Filled 2017-02-26: qty 1

## 2017-02-26 MED ORDER — BENAZEPRIL HCL 40 MG PO TABS: 40.0000 mg | ORAL_TABLET | Freq: Every day | ORAL | 6 refills | Status: DC

## 2017-02-26 MED ORDER — LIRAGLUTIDE 18 MG/3ML ~~LOC~~ SOPN: PEN_INJECTOR | SUBCUTANEOUS | 11 refills | Status: DC

## 2017-02-26 NOTE — Assessment & Plan Note (Signed)
Checking HgA1c today since she is here. Some non-compliance lately with meds due to cost but she is staying somewhat on track. She knows that she is not well controlled and is at high risk for more complications.

## 2017-02-26 NOTE — Patient Instructions (Signed)
We will check the labs today.   We have sent in the refills of the medicines.

## 2017-02-26 NOTE — ED Provider Notes (Signed)
[facility]-EMERGENCY DEPT Provider Note   CSN: [id number] Arrival date & time: 02/26/17  1448  By signing my name below, I, Marnette Burgess Long, attest that this documentation has been prepared under the direction and in the presence of [id number], [employer]. Electronically Signed: Marnette Burgess Long, Scribe. 02/26/2017. 3:18 PM.   History   Chief Complaint Chief Complaint  Patient presents with  . Rabies Injection   The history is provided by the patient and medical records. No language interpreter was used.  Wound Check  This is a new problem. The current episode started more than 1 week ago. The problem occurs rarely. The problem has been rapidly improving. Pertinent negatives include no chest pain, no abdominal pain and no shortness of breath. Nothing aggravates the symptoms. The symptoms are relieved by NSAIDs. Treatments tried: NSAIDs. The treatment provided significant relief.    HPI Comments:  Rita Hogan is a morbidly obese 31 y.o. female with a PMHx of DM2, Allergies, Heart Murmur, HTN, Depression, Asthma, HLD, and Anxiety, who presents to the Emergency Department for her 3rd out of 4 Rabies Vaccinations. Per chart review, she unfortunately was bit by a stray cat to the right hand on 02/18/17. She was seen in the ED the following day (02/19/17), had neg xray, started on rabies vaccine regimen and Rx'd Augmentin. The following day (02/20/17) she returned for worsening swelling/redness, ultimately was admitted overnight for IV abx and bedside I&D performed by Dr. Izora Ribas. She was instructed to continue massage and warm compresses to endorse drainage. Went home the following day, instructed to come back on days 3, 7 and 21 (*note error, should be day 14; pt later told correctly that is should be on day 14 not 21), and advised to continue augmentin as previously prescribed. She has been taking this compliantly, and denies any issues. She came back to the ED on 02/22/17 (day 3) and received her 2nd  vaccine, and presents today for her day 7 (3rd) vaccination. Denies any complaints or concerns from last vaccine/visit. Pt's hand is greatly improving with reduced swelling to the area, healing well, scabbed over, no ongoing drainage, no ongoing redness or warmth. States occasional mild pain if she hits the area, and uses NSAIDs with significant relief of this. Pt denies ongoing pain, drainage/redness/warmth, red streaking, oral/facial swelling, fevers, chills, CP, SOB, abd pain, N/V/D/C, hematuria, dysuria, myalgias, arthralgias, numbness, tingling, focal weakness, or any other complaints at this time.   Past Medical History:  Diagnosis Date  . Allergy   . Anxiety   . Asthma   . Depression   . Diabetes mellitus   . Heart murmur   . Hyperlipidemia   . Hypertension   . Morbid obesity [facility])    Patient Active Problem List   Diagnosis Date Noted  . Cat bite 02/20/2017  . Bite wound of right hand 02/20/2017  . Major depression, recurrent (HCC) 07/21/2016  . Essential hypertension 12/24/2015  . Morbid obesity (HCC) 12/24/2015  . Hidradenitis suppurativa 12/24/2015  . Rash and nonspecific skin eruption 12/24/2015  . Type 2 diabetes mellitus (HCC) 07/09/2011   Past Surgical History:  Procedure Laterality Date  . EYE SURGERY    . PREPATELLAR BURSA EXCISION     right   OB History    Gravida Para Term Preterm AB Living   0             SAB TAB Ectopic Multiple Live Births  Home Medications    Prior to Admission medications   Medication Sig Start Date End Date Taking? Authorizing Provider  amoxicillin-clavulanate (AUGMENTIN) 875-125 MG tablet Take 1 tablet by mouth every 12 (twelve) hours. 02/19/17   Eyvonne Mechanic, PA-C  benazepril (LOTENSIN) 40 MG tablet Take 1 tablet (40 mg total) by mouth daily. 02/26/17   Myrlene Broker, MD  buPROPion (WELLBUTRIN XL) 300 MG 24 hr tablet TAKE 1 TABLET(300 MG) BY MOUTH DAILY 02/26/17   Myrlene Broker, MD  canagliflozin  [facility]) 300 MG TABS tablet Take 1 tablet (300 mg total) by mouth daily before breakfast. Reported on 12/24/2015 02/26/17   Myrlene Broker, MD  cetirizine (ZYRTEC) 10 MG tablet Take 1 tablet (10 mg total) by mouth daily. 08/04/16   Myrlene Broker, MD  citalopram (CELEXA) 40 MG tablet TAKE 1 TABLET(40 MG) BY MOUTH DAILY 02/26/17   Myrlene Broker, MD  fluconazole (DIFLUCAN) 150 MG tablet Take 1 tablet (150 mg total) by mouth every 3 (three) days. 07/21/16   Myrlene Broker, MD  glimepiride (AMARYL) 4 MG tablet Take 1 tablet (4 mg total) by mouth daily with breakfast. 02/26/17   Myrlene Broker, MD  glucose blood (ACCU-CHEK AVIVA PLUS) test strip 1 each by Other route 2 (two) times daily. Use to check blood sugar twice a day. Dx E11.9 08/27/16   Myrlene Broker, MD  hydrOXYzine (VISTARIL) 25 MG capsule TAKE 2 CAPSULES(50 MG) BY MOUTH EVERY 6 HOURS AS NEEDED FOR ANXIETY 01/14/17   Myrlene Broker, MD  Insulin Pen Needle (SURE COMFORT PEN NEEDLES) 30G X 8 MM MISC Use for injections 08/04/16   Myrlene Broker, MD  liraglutide (VICTOZA) 18 MG/3ML SOPN ADMINISTER 1.8 MG UNDER THE SKIN EVERY DAY 02/26/17   Myrlene Broker, MD  montelukast (SINGULAIR) 10 MG tablet Take 1 tablet (10 mg total) by mouth daily. 04/15/16   Myrlene Broker, MD  rosuvastatin (CRESTOR) 20 MG tablet Take 1 tablet (20 mg total) by mouth daily. Reported on 12/24/2015 04/15/16   Myrlene Broker, MD  traMADol (ULTRAM) 50 MG tablet Take 1 tablet (50 mg total) by mouth every 6 (six) hours as needed. Patient not taking: Reported on 02/26/2017 02/19/17   Eyvonne Mechanic, PA-C  traZODone (DESYREL) 50 MG tablet Take 1.5 tablets (75 mg total) by mouth at bedtime as needed for sleep. 02/26/17   Myrlene Broker, MD  triamcinolone cream (KENALOG) 0.1 % Apply 1 application topically 2 (two) times daily. 10/21/16   Myrlene Broker, MD   Family History Family History  Problem Relation Age of Onset    . Adopted: Yes   Social History Social History  Substance Use Topics  . Smoking status: Former Smoker    Quit date: 07/13/2012  . Smokeless tobacco: Former Neurosurgeon  . Alcohol use Yes     Comment: rare    Allergies   Metformin and related; Other; and Oxycodone   Review of Systems Review of Systems  Constitutional: Negative for chills and fever.  HENT: Negative for facial swelling.   Respiratory: Negative for shortness of breath.   Cardiovascular: Negative for chest pain.  Gastrointestinal: Negative for abdominal pain, constipation, diarrhea, nausea and vomiting.  Genitourinary: Negative for dysuria and hematuria.  Musculoskeletal: Negative for arthralgias, joint swelling and myalgias.  Skin: Positive for wound (healing nicely). Negative for color change.  Allergic/Immunologic: Positive for immunocompromised state (DM2).  Neurological: Negative for weakness and numbness.  Psychiatric/Behavioral: Negative for confusion.  All systems reviewed and are negative for acute change except as noted in the HPI.    Physical Exam Updated Vital Signs BP (!) 151/94 (BP Location: Left Arm)   Pulse 100   Temp 98.9 F (37.2 C) (Oral)   Resp 16   Ht  (1.549 m)   Wt 259 lb (117.5 kg)   LMP 01/29/2017   SpO2 97%   BMI 48.94 kg/m   Physical Exam  Constitutional: She is oriented to person, place, and time. Vital signs are normal. She appears well-developed and well-nourished.  Non-toxic appearance. No distress.  Afebrile, nontoxic, NAD  HENT:  Head: Normocephalic and atraumatic.  Mouth/Throat: Mucous membranes are normal.  Eyes: Conjunctivae and EOM are normal. Right eye exhibits no discharge. Left eye exhibits no discharge.  Neck: Normal range of motion. Neck supple.  Cardiovascular: Normal rate and intact distal pulses.   Pulmonary/Chest: Effort normal. No respiratory distress.  Abdominal: Normal appearance. She exhibits no distension.  Musculoskeletal: Normal range of motion.   Neurological: She is alert and oriented to person, place, and time. She has normal strength. No sensory deficit.  Skin: Skin is warm, dry and intact. No rash noted.  R thenar eminence with scabbed over healing puncture wounds, no surrounding erythema or warmth, no drainage, no swelling, and no significant tenderness. FROM intact in all joints of the hand. No red streaking.   Psychiatric: She has a normal mood and affect. Her behavior is normal.  Nursing note and vitals reviewed.   ED Treatments / Results  DIAGNOSTIC STUDIES:  Oxygen Saturation is 97% on RA, normal by my interpretation.    COORDINATION OF CARE:  3:13 PM Discussed treatment plan with pt at bedside including injection and pt agreed to plan.  Labs (all labs ordered are listed, but only abnormal results are displayed) Labs Reviewed - No data to display  EKG  EKG Interpretation None       Radiology No results found.  Procedures Procedures (including critical care time)  Medications Ordered in ED Medications  rabies vaccine (RABAVERT) injection 1 mL (not administered)     Initial Impression / Assessment and Plan / ED Course  I have reviewed the triage vital signs and the nursing notes.  Pertinent labs & imaging results that were available during my care of the patient were reviewed by me and considered in my medical decision making (see chart for details).     31 y.o. female here for her third of four rabies vaccines. In summary, stray cat bite on R hand 02/18/17, came to ED 02/19/17, xray neg, started on rabies vaccines and augmentin; came back 02/20/17, had bedside I&D by Dr. Izora Ribas and admitted overnight for IV abx; discharged with instructions to return day 3, 7, and 21 (note error, should be day 14) for rabies shots, and continue augmentin; came back 02/22/17 for second rabies vaccine, and back today for third. Knows to come back in 1wk (03/05/17) for 4th vaccine. No issues or complaints, hand healing nicely,  no evidence of worsening infection or cellulitis, no abscess formation. Advised continuation of home meds, rabies vaccine given today, advised OTC remedies for wound care and pain control. F/up for last rabies shot next week, and with PCP in 1wk for wound recheck and ongoing care. I explained the diagnosis and have given explicit precautions to return to the ER including for any other new or worsening symptoms. The patient understands and accepts the medical plan as it's been dictated and I have  answered their questions. Discharge instructions concerning home care and prescriptions have been given. The patient is STABLE and is discharged to home in good condition.   I personally performed the services described in this documentation, which was scribed in my presence. The recorded information has been reviewed and is accurate.   Final Clinical Impressions(s) / ED Diagnoses   Final diagnoses:  Need for post exposure prophylaxis for rabies  Cat bite, subsequent encounter    New Prescriptions New Prescriptions   No medications on file     [address], PA-C 02/26/17 1528    Shaune Pollack, MD 02/27/17 1151

## 2017-02-26 NOTE — ED Triage Notes (Signed)
Pt here for follow-up for her rabies vaccinations. A&Ox4. No other complaints.

## 2017-02-26 NOTE — Progress Notes (Signed)
   Subjective:    Patient ID: Rita Hogan, female    DOB: 02-20-1986, 31 y.o.   MRN: [id number]  HPI The patient is a 31 YO female coming in for hospital follow up. She was bitten by a cat and admitted for I and D and IV antibiotics. She also has been doing the rabies series and is due for 3rd shot today. She intends to finish the series. She is having healing of the wound, no redness. The swelling is going down quite a lot. She is still having some tenderness in the area. Working on getting back on her diabetes meds but the cost is high and she is working on getting.   PMH, [facility], social history reviewed and updated.   Review of Systems  Constitutional: Negative for activity change, appetite change, fatigue, fever and unexpected weight change.  HENT: Negative.   Eyes: Negative.   Respiratory: Negative.   Cardiovascular: Negative.   Gastrointestinal: Negative.   Musculoskeletal: Positive for myalgias. Negative for arthralgias, back pain, gait problem, neck pain and neck stiffness.  Skin: Negative.   Neurological: Negative.   Psychiatric/Behavioral: Negative.       Objective:   Physical Exam  Constitutional: She is oriented to person, place, and time. She appears well-developed and well-nourished.  overweight  HENT:  Head: Normocephalic and atraumatic.  Eyes: EOM are normal.  Neck: Normal range of motion.  Cardiovascular: Normal rate and regular rhythm.   Pulmonary/Chest: Effort normal. No respiratory distress. She has no wheezes. She has no rales.  Abdominal: Soft. She exhibits no distension. There is no tenderness. There is no rebound.  Neurological: She is alert and oriented to person, place, and time. Coordination normal.  Skin: Skin is warm and dry.  Wound on the right 1st metacarpal pad appears to be healing, no abscess formation or redness. No swelling.    Vitals:   02/26/17 1354  BP: (!) 150/96  Pulse: (!) 106  Resp: 14  Temp: 98.3 F (36.8 C)  TempSrc: Oral    SpO2: 100%  Weight: 259 lb (117.5 kg)  Height:  (1.549 m)      Assessment & Plan:

## 2017-02-26 NOTE — Discharge Instructions (Addendum)
Continue keeping your hand wound clean with mild soap and water. Use heat and Ice and elevate for additional pain and swelling relief . Alternate between Ibuprofen and Tylenol for additional pain relief. Continue your augmentin until finished. Come back here or to urgent care in 1 week (on 03/05/17) for your LAST rabies vaccine. Follow up with your primary care doctor in approximately 7-10 days for wound recheck and recheck of symptoms. Monitor area for signs of infection to include, but not limited to: increasing pain, spreading redness, drainage/pus, worsening swelling, or fevers. Return to emergency department for emergent changing or worsening symptoms.

## 2017-02-26 NOTE — Assessment & Plan Note (Signed)
She still has 3 doses of augmentin and wound appears to be healing well. She will finish this as well as the rabies series. They were not able to find the cat and test it. The pain is decreasing.

## 2017-02-26 NOTE — Assessment & Plan Note (Signed)
Finishing antibiotics and rabies series. She is healing well and knows to avoid stray animals if possible.

## 2017-02-26 NOTE — Progress Notes (Signed)
Pre visit review using our clinic review tool, if applicable. No additional management support is needed unless otherwise documented below in the visit note. 

## 2017-03-05 ENCOUNTER — Encounter (HOSPITAL_COMMUNITY): Payer: Self-pay | Admitting: Emergency Medicine

## 2017-03-05 ENCOUNTER — Emergency Department (HOSPITAL_COMMUNITY)
Admission: EM | Admit: 2017-03-05 | Discharge: 2017-03-05 | Disposition: A | Discharge status: Home / Self Care | Payer: BLUE CROSS/BLUE SHIELD | Attending: Emergency Medicine | Admitting: Emergency Medicine

## 2017-03-05 DIAGNOSIS — Z87891 Personal history of nicotine dependence: Secondary | ICD-10-CM | POA: Diagnosis not present

## 2017-03-05 DIAGNOSIS — Z203 Contact with and (suspected) exposure to rabies: Secondary | ICD-10-CM | POA: Insufficient documentation

## 2017-03-05 DIAGNOSIS — W5501XD Bitten by cat, subsequent encounter: Secondary | ICD-10-CM | POA: Insufficient documentation

## 2017-03-05 DIAGNOSIS — Z5189 Encounter for other specified aftercare: Secondary | ICD-10-CM

## 2017-03-05 DIAGNOSIS — J45909 Unspecified asthma, uncomplicated: Secondary | ICD-10-CM | POA: Insufficient documentation

## 2017-03-05 DIAGNOSIS — S61451D Open bite of right hand, subsequent encounter: Secondary | ICD-10-CM | POA: Insufficient documentation

## 2017-03-05 DIAGNOSIS — Z23 Encounter for immunization: Secondary | ICD-10-CM | POA: Insufficient documentation

## 2017-03-05 DIAGNOSIS — Z79899 Other long term (current) drug therapy: Secondary | ICD-10-CM | POA: Diagnosis not present

## 2017-03-05 DIAGNOSIS — I10 Essential (primary) hypertension: Secondary | ICD-10-CM | POA: Insufficient documentation

## 2017-03-05 DIAGNOSIS — E119 Type 2 diabetes mellitus without complications: Secondary | ICD-10-CM | POA: Insufficient documentation

## 2017-03-05 DIAGNOSIS — Z7984 Long term (current) use of oral hypoglycemic drugs: Secondary | ICD-10-CM | POA: Insufficient documentation

## 2017-03-05 MED ORDER — RABIES VACCINE, PCEC IM SUSR
1.0000 mL | Freq: Once | INTRAMUSCULAR | Status: AC
  Administered 2017-03-05: 1 mL via INTRAMUSCULAR
  Filled 2017-03-05: qty 1

## 2017-03-05 NOTE — Discharge Instructions (Signed)
You received your 4th and last rabies vaccine today.    Your wound is healing appropriately without signs of infection.  Continue monitor for signs of infection including worsening pain, yellow or white discharge, bleeding or increased warmth or fevers. Return to the ED if you develop these symptoms.   You may take benadryl or claritin for the local itching.  You may put a very thin layer of vaseline or hydrocortisone cream to area of itchiness to help with this as well.

## 2017-03-05 NOTE — ED Triage Notes (Signed)
Pt here for fourth rabies vaccination. No complaints.

## 2017-03-05 NOTE — ED Provider Notes (Signed)
[facility]-EMERGENCY DEPT Provider Note   CSN: [id number] Arrival date & time: 03/05/17  0915     History   Chief Complaint Chief Complaint  Patient presents with  . rabies shot    HPI Rita Hogan is a 31 y.o. female area of type 2 diabetes mellitus, hypertension and obesity presents to the ED for fourth and final rabies vaccination. Per chart review, patient was bit by a stray cat on 02/19/17.  She was seen in ED on 02/20/17, x-rays negative, discharged with augmentin after receiving first rabies vaccine.  Patient developed worsening infection the next day and was admitted for overnight IV abx and drainage on 02/20/17, discharged the next day and advised to f/u on Day 3, 7 and 14 for full prophylaxis rabies regimen.  Patient has been compliant with augmentin and has presented to ED for rabies vaccine on day 3 and 7 for second and third rabies vaccines.  Patient denies fevers, increased redness, swelling, or discharge. Patient denies pain and reports slight local itching.    HPI  Past Medical History:  Diagnosis Date  . Allergy   . Anxiety   . Asthma   . Depression   . Diabetes mellitus   . Heart murmur   . Hyperlipidemia   . Hypertension   . Morbid obesity [facility])     Patient Active Problem List   Diagnosis Date Noted  . Cat bite 02/20/2017  . Bite wound of right hand 02/20/2017  . Major depression, recurrent (HCC) 07/21/2016  . Essential hypertension 12/24/2015  . Morbid obesity (HCC) 12/24/2015  . Hidradenitis suppurativa 12/24/2015  . Type 2 diabetes mellitus (HCC) 07/09/2011    Past Surgical History:  Procedure Laterality Date  . EYE SURGERY    . PREPATELLAR BURSA EXCISION     right    OB History    Gravida Para Term Preterm AB Living   0             SAB TAB Ectopic Multiple Live Births                   Home Medications    Prior to Admission medications   Medication Sig Start Date End Date Taking? Authorizing Provider  amoxicillin-clavulanate  (AUGMENTIN) 875-125 MG tablet Take 1 tablet by mouth every 12 (twelve) hours. 02/19/17   Eyvonne Mechanic, PA-C  benazepril (LOTENSIN) 40 MG tablet Take 1 tablet (40 mg total) by mouth daily. 02/26/17   Myrlene Broker, MD  buPROPion (WELLBUTRIN XL) 300 MG 24 hr tablet TAKE 1 TABLET(300 MG) BY MOUTH DAILY 02/26/17   Myrlene Broker, MD  canagliflozin [facility]) 300 MG TABS tablet Take 1 tablet (300 mg total) by mouth daily before breakfast. Reported on 12/24/2015 02/26/17   Myrlene Broker, MD  cetirizine (ZYRTEC) 10 MG tablet Take 1 tablet (10 mg total) by mouth daily. 08/04/16   Myrlene Broker, MD  citalopram (CELEXA) 40 MG tablet TAKE 1 TABLET(40 MG) BY MOUTH DAILY 02/26/17   Myrlene Broker, MD  fluconazole (DIFLUCAN) 150 MG tablet Take 1 tablet (150 mg total) by mouth every 3 (three) days. 07/21/16   Myrlene Broker, MD  glimepiride (AMARYL) 4 MG tablet Take 1 tablet (4 mg total) by mouth daily with breakfast. 02/26/17   Myrlene Broker, MD  glucose blood (ACCU-CHEK AVIVA PLUS) test strip 1 each by Other route 2 (two) times daily. Use to check blood sugar twice a day. Dx E11.9 08/27/16   Myrlene Broker,  MD  hydrOXYzine (VISTARIL) 25 MG capsule TAKE 2 CAPSULES(50 MG) BY MOUTH EVERY 6 HOURS AS NEEDED FOR ANXIETY 01/14/17   Myrlene Broker, MD  Insulin Pen Needle (SURE COMFORT PEN NEEDLES) 30G X 8 MM MISC Use for injections 08/04/16   Myrlene Broker, MD  liraglutide (VICTOZA) 18 MG/3ML SOPN ADMINISTER 1.8 MG UNDER THE SKIN EVERY DAY 02/26/17   Myrlene Broker, MD  montelukast (SINGULAIR) 10 MG tablet Take 1 tablet (10 mg total) by mouth daily. 04/15/16   Myrlene Broker, MD  rosuvastatin (CRESTOR) 20 MG tablet Take 1 tablet (20 mg total) by mouth daily. Reported on 12/24/2015 04/15/16   Myrlene Broker, MD  traMADol (ULTRAM) 50 MG tablet Take 1 tablet (50 mg total) by mouth every 6 (six) hours as needed. Patient not taking: Reported on 02/26/2017  02/19/17   Eyvonne Mechanic, PA-C  traZODone (DESYREL) 50 MG tablet Take 1.5 tablets (75 mg total) by mouth at bedtime as needed for sleep. 02/26/17   Myrlene Broker, MD  triamcinolone cream (KENALOG) 0.1 % Apply 1 application topically 2 (two) times daily. 10/21/16   Myrlene Broker, MD    Family History Family History  Problem Relation Age of Onset  . Adopted: Yes    Social History Social History  Substance Use Topics  . Smoking status: Former Smoker    Quit date: 07/13/2012  . Smokeless tobacco: Former Neurosurgeon  . Alcohol use Yes     Comment: rare     Allergies   Metformin and related; Other; and Oxycodone   Review of Systems Review of Systems  Constitutional: Negative for fever.  Gastrointestinal: Negative for nausea and vomiting.  Musculoskeletal: Negative for arthralgias and joint swelling.  Skin: Positive for wound.     Physical Exam Updated Vital Signs BP (!) 119/53 (BP Location: Left Arm)   Pulse 90   Temp 98.2 F (36.8 C) (Oral)   Resp 18   LMP 03/04/2017 (Exact Date)   SpO2 100%   Physical Exam  Constitutional: She is oriented to person, place, and time. She appears well-developed and well-nourished. She is cooperative. No distress.  HENT:  Head: Normocephalic and atraumatic.  Eyes: Conjunctivae are normal. No scleral icterus.  Cardiovascular: Normal rate, regular rhythm, normal heart sounds and intact distal pulses.   No murmur heard. Pulmonary/Chest: Effort normal and breath sounds normal. She has no wheezes.  Musculoskeletal: Normal range of motion. She exhibits no deformity.  Full active ROM of right fingers and wrist without reported pain No focal right hand joint edema, erythema or tenderness. No signs of joint infection in right hand.  Neurological: She is alert and oriented to person, place, and time.  Skin: Skin is warm and dry. Capillary refill takes less than 2 seconds. Laceration noted.  2 cm x 2 cm area of light erythema at right  first finger web without fluctuance, warmth, edema or discharge. No signs of infection.   Psychiatric: She has a normal mood and affect. Her behavior is normal. Judgment and thought content normal.  Nursing note and vitals reviewed.    ED Treatments / Results  Labs (all labs ordered are listed, but only abnormal results are displayed) Labs Reviewed - No data to display  EKG  EKG Interpretation None       Radiology No results found.  Procedures Procedures (including critical care time)  Medications Ordered in ED Medications  rabies vaccine (RABAVERT) injection 1 mL (1 mL Intramuscular Given 03/05/17 1002)  Initial Impression / Assessment and Plan / ED Course  I have reviewed the triage vital signs and the nursing notes.  Pertinent labs & imaging results that were available during my care of the patient were reviewed by me and considered in my medical decision making (see chart for details).  31 yo female with pertinent pmh of T2DM presents for wound check and need for post exposure prophylaxis for rabies after cat bite to right hand (thenar area) that occurred on 02/18/17.  Patient has been compliant with augmentin, wound care and has presented to all wound check f/u and rabies prophylaxis vaccines.  On exam there are no signs of wound infection or dehiscence. Patient has no tenderness. Full ROM of right hand fingers and wrist. No signs of cellulitis, abscess, septic arthritis or tenosynovitis.  Patient received 4th rabies vaccine today.  Patient considered safe for discharge.  Wound care instructions given. Patient is aware of red flag symptoms to monitor for that would warrant return to ED for re-evaluation. Patient verbalized understanding and is agreeable to plan.  Final Clinical Impressions(s) / ED Diagnoses   Final diagnoses:  Visit for wound check  Need for post exposure prophylaxis for rabies  Cat bite, subsequent encounter    New Prescriptions Discharge  Medication List as of 03/05/2017  9:57 AM       Liberty Handy, PA-C 03/05/17 1010    Lorre Nick, MD 03/06/17 [phone]

## 2017-03-11 ENCOUNTER — Other Ambulatory Visit: Payer: Self-pay | Admitting: Internal Medicine

## 2017-04-10 ENCOUNTER — Encounter (HOSPITAL_COMMUNITY): Payer: Self-pay | Admitting: Emergency Medicine

## 2017-04-10 ENCOUNTER — Inpatient Hospital Stay (HOSPITAL_COMMUNITY)
Admission: EM | Admit: 2017-04-10 | Discharge: 2017-04-12 | DRG: 638 | Disposition: A | Discharge status: Home / Self Care | Payer: BLUE CROSS/BLUE SHIELD | Attending: Internal Medicine | Admitting: Internal Medicine

## 2017-04-10 DIAGNOSIS — E785 Hyperlipidemia, unspecified: Secondary | ICD-10-CM | POA: Diagnosis present

## 2017-04-10 DIAGNOSIS — Z6841 Body Mass Index (BMI) 40.0 and over, adult: Secondary | ICD-10-CM

## 2017-04-10 DIAGNOSIS — R112 Nausea with vomiting, unspecified: Secondary | ICD-10-CM

## 2017-04-10 DIAGNOSIS — R197 Diarrhea, unspecified: Secondary | ICD-10-CM | POA: Diagnosis not present

## 2017-04-10 DIAGNOSIS — E876 Hypokalemia: Secondary | ICD-10-CM

## 2017-04-10 DIAGNOSIS — J45909 Unspecified asthma, uncomplicated: Secondary | ICD-10-CM | POA: Diagnosis present

## 2017-04-10 DIAGNOSIS — E669 Obesity, unspecified: Secondary | ICD-10-CM | POA: Diagnosis present

## 2017-04-10 DIAGNOSIS — E860 Dehydration: Secondary | ICD-10-CM | POA: Diagnosis not present

## 2017-04-10 DIAGNOSIS — K529 Noninfective gastroenteritis and colitis, unspecified: Secondary | ICD-10-CM

## 2017-04-10 DIAGNOSIS — Z87891 Personal history of nicotine dependence: Secondary | ICD-10-CM

## 2017-04-10 DIAGNOSIS — E0810 Diabetes mellitus due to underlying condition with ketoacidosis without coma: Secondary | ICD-10-CM

## 2017-04-10 DIAGNOSIS — I10 Essential (primary) hypertension: Secondary | ICD-10-CM | POA: Diagnosis present

## 2017-04-10 DIAGNOSIS — E1110 Type 2 diabetes mellitus with ketoacidosis without coma: Principal | ICD-10-CM | POA: Diagnosis present

## 2017-04-10 DIAGNOSIS — Z888 Allergy status to other drugs, medicaments and biological substances status: Secondary | ICD-10-CM

## 2017-04-10 DIAGNOSIS — E119 Type 2 diabetes mellitus without complications: Secondary | ICD-10-CM

## 2017-04-10 DIAGNOSIS — F419 Anxiety disorder, unspecified: Secondary | ICD-10-CM | POA: Diagnosis present

## 2017-04-10 DIAGNOSIS — Z79899 Other long term (current) drug therapy: Secondary | ICD-10-CM

## 2017-04-10 DIAGNOSIS — F329 Major depressive disorder, single episode, unspecified: Secondary | ICD-10-CM | POA: Diagnosis present

## 2017-04-10 LAB — CBG MONITORING, ED
GLUCOSE-CAPILLARY: 167 mg/dL — AB (ref 65–99)
GLUCOSE-CAPILLARY: 161 mg/dL — AB (ref 65–99)

## 2017-04-10 LAB — GLUCOSE, CAPILLARY
GLUCOSE-CAPILLARY: 167 mg/dL — AB (ref 65–99)
GLUCOSE-CAPILLARY: 170 mg/dL — AB (ref 65–99)
GLUCOSE-CAPILLARY: 156 mg/dL — AB (ref 65–99)
GLUCOSE-CAPILLARY: 176 mg/dL — AB (ref 65–99)
Glucose-Capillary: 192 mg/dL — ABNORMAL HIGH (ref 65–99)
Glucose-Capillary: 192 mg/dL — ABNORMAL HIGH (ref 65–99)
Glucose-Capillary: 160 mg/dL — ABNORMAL HIGH (ref 65–99)
Glucose-Capillary: 177 mg/dL — ABNORMAL HIGH (ref 65–99)

## 2017-04-10 LAB — URINALYSIS, ROUTINE W REFLEX MICROSCOPIC
BACTERIA UA: NONE SEEN
Bilirubin Urine: NEGATIVE
KETONES UR: 80 mg/dL — AB
Leukocytes, UA: NEGATIVE
NITRITE: NEGATIVE
PROTEIN: 30 mg/dL — AB
Specific Gravity, Urine: 1.029 (ref 1.005–1.030)
pH: 6 (ref 5.0–8.0)

## 2017-04-10 LAB — BASIC METABOLIC PANEL
Anion gap: 10 (ref 5–15)
Anion gap: 8 (ref 5–15)
BUN: 14 mg/dL (ref 6–20)
BUN: 13 mg/dL (ref 6–20)
CALCIUM: 7.8 mg/dL — AB (ref 8.9–10.3)
CALCIUM: 7.9 mg/dL — AB (ref 8.9–10.3)
CO2: 13 mmol/L — ABNORMAL LOW (ref 22–32)
CO2: 13 mmol/L — ABNORMAL LOW (ref 22–32)
CREATININE: 0.5 mg/dL (ref 0.44–1.00)
CREATININE: 0.43 mg/dL — AB (ref 0.44–1.00)
Chloride: 111 mmol/L (ref 101–111)
Chloride: 112 mmol/L — ABNORMAL HIGH (ref 101–111)
GFR calc non Af Amer: >60 mL/min (ref >60)
GFR calc non Af Amer: >60 mL/min (ref >60)
Glucose, Bld: 172 mg/dL — ABNORMAL HIGH (ref 65–99)
Glucose, Bld: 177 mg/dL — ABNORMAL HIGH (ref 65–99)
Potassium: 3.5 mmol/L (ref 3.5–5.1)
Potassium: 3.4 mmol/L — ABNORMAL LOW (ref 3.5–5.1)
Sodium: 134 mmol/L — ABNORMAL LOW (ref 135–145)
Sodium: 133 mmol/L — ABNORMAL LOW (ref 135–145)

## 2017-04-10 LAB — I-STAT CHEM 8, ED
BUN: 15 mg/dL (ref 6–20)
CALCIUM ION: 1.09 mmol/L — AB (ref 1.15–1.40)
CREATININE: 0.3 mg/dL — AB (ref 0.44–1.00)
Chloride: 107 mmol/L (ref 101–111)
Glucose, Bld: 215 mg/dL — ABNORMAL HIGH (ref 65–99)
HEMATOCRIT: 40 % (ref 36.0–46.0)
HEMOGLOBIN: 13.6 g/dL (ref 12.0–15.0)
Potassium: 3.4 mmol/L — ABNORMAL LOW (ref 3.5–5.1)
SODIUM: 135 mmol/L (ref 135–145)
TCO2: 14 mmol/L (ref 0–100)

## 2017-04-10 LAB — COMPREHENSIVE METABOLIC PANEL
ALBUMIN: 3.8 g/dL (ref 3.5–5.0)
ALT: 27 U/L (ref 14–54)
ANION GAP: 16 — AB (ref 5–15)
AST: 22 U/L (ref 15–41)
Alkaline Phosphatase: 67 U/L (ref 38–126)
BILIRUBIN TOTAL: 1 mg/dL (ref 0.3–1.2)
BUN: 14 mg/dL (ref 6–20)
CHLORIDE: 101 mmol/L (ref 101–111)
CO2: 12 mmol/L — AB (ref 22–32)
Calcium: 8.7 mg/dL — ABNORMAL LOW (ref 8.9–10.3)
Creatinine, Ser: 0.71 mg/dL (ref 0.44–1.00)
GFR calc Af Amer: >60 mL/min (ref >60)
GFR calc non Af Amer: >60 mL/min (ref >60)
GLUCOSE: 261 mg/dL — AB (ref 65–99)
POTASSIUM: 3.7 mmol/L (ref 3.5–5.1)
SODIUM: 129 mmol/L — AB (ref 135–145)
Total Protein: 7.9 g/dL (ref 6.5–8.1)

## 2017-04-10 LAB — LIPASE, BLOOD: LIPASE: 21 U/L (ref 11–51)

## 2017-04-10 LAB — CBC
HEMATOCRIT: 48.5 % — AB (ref 36.0–46.0)
HEMOGLOBIN: 17.6 g/dL — AB (ref 12.0–15.0)
MCH: 29.3 pg (ref 26.0–34.0)
MCHC: 36.3 g/dL — ABNORMAL HIGH (ref 30.0–36.0)
MCV: 80.8 fL (ref 78.0–100.0)
Platelets: 308 10*3/uL (ref 150–400)
RBC: 6 MIL/uL — ABNORMAL HIGH (ref 3.87–5.11)
RDW: 12 % (ref 11.5–15.5)
WBC: 5.4 10*3/uL (ref 4.0–10.5)

## 2017-04-10 LAB — I-STAT BETA HCG BLOOD, ED (MC, WL, AP ONLY)

## 2017-04-10 LAB — MRSA PCR SCREENING: MRSA by PCR: NEGATIVE

## 2017-04-10 MED ORDER — SODIUM CHLORIDE 0.9 % IV BOLUS (SEPSIS)
1000.0000 mL | Freq: Once | INTRAVENOUS | Status: AC
  Administered 2017-04-10: 1000 mL via INTRAVENOUS

## 2017-04-10 MED ORDER — MONTELUKAST SODIUM 10 MG PO TABS
10.0000 mg | ORAL_TABLET | Freq: Every day | ORAL | Status: DC
  Administered 2017-04-12: 10 mg via ORAL
  Filled 2017-04-10 (×2): qty 1

## 2017-04-10 MED ORDER — ORAL CARE MOUTH RINSE: 15.0000 mL | Freq: Two times a day (BID) | OROMUCOSAL | Status: DC

## 2017-04-10 MED ORDER — FENTANYL CITRATE (PF) 100 MCG/2ML IJ SOLN
50.0000 ug | Freq: Once | INTRAMUSCULAR | Status: AC
  Administered 2017-04-10: 50 ug via INTRAVENOUS
  Filled 2017-04-10: qty 2

## 2017-04-10 MED ORDER — SODIUM CHLORIDE 0.9 % IV SOLN
INTRAVENOUS | Status: DC
  Administered 2017-04-10: 1 [IU]/h via INTRAVENOUS
  Filled 2017-04-10 (×3): qty 1

## 2017-04-10 MED ORDER — HYDROXYZINE HCL 25 MG PO TABS: 50.0000 mg | ORAL_TABLET | Freq: Four times a day (QID) | ORAL | Status: DC | PRN

## 2017-04-10 MED ORDER — ROSUVASTATIN CALCIUM 20 MG PO TABS
20.0000 mg | ORAL_TABLET | Freq: Every day | ORAL | Status: DC
  Administered 2017-04-12: 20 mg via ORAL
  Filled 2017-04-10: qty 1

## 2017-04-10 MED ORDER — ONDANSETRON HCL 4 MG/2ML IJ SOLN
4.0000 mg | Freq: Once | INTRAMUSCULAR | Status: AC
  Administered 2017-04-10: 4 mg via INTRAVENOUS
  Filled 2017-04-10: qty 2

## 2017-04-10 MED ORDER — DEXTROSE-NACL 5-0.45 % IV SOLN: INTRAVENOUS | Status: DC

## 2017-04-10 MED ORDER — SODIUM CHLORIDE 0.9 % IV SOLN: INTRAVENOUS | Status: DC

## 2017-04-10 MED ORDER — KETOROLAC TROMETHAMINE 30 MG/ML IJ SOLN
30.0000 mg | Freq: Once | INTRAMUSCULAR | Status: AC
  Administered 2017-04-10: 30 mg via INTRAVENOUS
  Filled 2017-04-10: qty 1

## 2017-04-10 MED ORDER — BUPROPION HCL ER (XL) 300 MG PO TB24
300.0000 mg | ORAL_TABLET | Freq: Every day | ORAL | Status: DC
  Administered 2017-04-12: 300 mg via ORAL
  Filled 2017-04-10 (×2): qty 1

## 2017-04-10 MED ORDER — PROMETHAZINE HCL 25 MG/ML IJ SOLN
25.0000 mg | Freq: Once | INTRAMUSCULAR | Status: AC
  Administered 2017-04-10: 25 mg via INTRAVENOUS
  Filled 2017-04-10: qty 1

## 2017-04-10 MED ORDER — CITALOPRAM HYDROBROMIDE 20 MG PO TABS
40.0000 mg | ORAL_TABLET | Freq: Every day | ORAL | Status: DC
  Administered 2017-04-12: 40 mg via ORAL
  Filled 2017-04-10 (×2): qty 2

## 2017-04-10 MED ORDER — SODIUM CHLORIDE 0.9 % IV BOLUS (SEPSIS)
500.0000 mL | Freq: Once | INTRAVENOUS | Status: AC
  Administered 2017-04-10: 500 mL via INTRAVENOUS

## 2017-04-10 MED ORDER — INSULIN REGULAR BOLUS VIA INFUSION
0.0000 [IU] | Freq: Three times a day (TID) | INTRAVENOUS | Status: DC
  Administered 2017-04-12: 6 [IU] via INTRAVENOUS
  Filled 2017-04-10: qty 10

## 2017-04-10 MED ORDER — TRAZODONE HCL 50 MG PO TABS: 75.0000 mg | ORAL_TABLET | Freq: Every evening | ORAL | Status: DC | PRN

## 2017-04-10 MED ORDER — KETOROLAC TROMETHAMINE 30 MG/ML IJ SOLN
30.0000 mg | Freq: Four times a day (QID) | INTRAMUSCULAR | Status: DC | PRN
  Administered 2017-04-10 – 2017-04-11 (×5): 30 mg via INTRAVENOUS
  Filled 2017-04-10 (×5): qty 1

## 2017-04-10 MED ORDER — ACETAMINOPHEN 650 MG RE SUPP
650.0000 mg | RECTAL | Status: DC | PRN
  Administered 2017-04-10: 650 mg via RECTAL
  Filled 2017-04-10: qty 1

## 2017-04-10 MED ORDER — DEXTROSE 50 % IV SOLN: 25.0000 mL | INTRAVENOUS | Status: DC | PRN

## 2017-04-10 MED ORDER — KCL IN DEXTROSE-NACL 20-5-0.45 MEQ/L-%-% IV SOLN
INTRAVENOUS | Status: DC
  Administered 2017-04-10 – 2017-04-12 (×4): via INTRAVENOUS
  Filled 2017-04-10 (×4): qty 1000

## 2017-04-10 MED ORDER — POTASSIUM CHLORIDE 10 MEQ/100ML IV SOLN
10.0000 meq | INTRAVENOUS | Status: AC
  Administered 2017-04-10 (×2): 10 meq via INTRAVENOUS
  Filled 2017-04-10 (×2): qty 100

## 2017-04-10 MED ORDER — SODIUM CHLORIDE 0.9 % IV BOLUS (SEPSIS)
2000.0000 mL | Freq: Once | INTRAVENOUS | Status: AC
  Administered 2017-04-10: 2000 mL via INTRAVENOUS

## 2017-04-10 MED ORDER — ENOXAPARIN SODIUM 40 MG/0.4ML ~~LOC~~ SOLN
40.0000 mg | SUBCUTANEOUS | Status: DC
  Administered 2017-04-10 – 2017-04-11 (×2): 40 mg via SUBCUTANEOUS
  Filled 2017-04-10 (×2): qty 0.4

## 2017-04-10 MED ORDER — METOCLOPRAMIDE HCL 5 MG/ML IJ SOLN
10.0000 mg | Freq: Four times a day (QID) | INTRAMUSCULAR | Status: DC | PRN
  Administered 2017-04-10 – 2017-04-11 (×2): 10 mg via INTRAVENOUS
  Filled 2017-04-10 (×2): qty 2

## 2017-04-10 MED ORDER — INSULIN ASPART 100 UNIT/ML ~~LOC~~ SOLN: 0.0000 [IU] | SUBCUTANEOUS | Status: DC

## 2017-04-10 MED ORDER — LORATADINE 10 MG PO TABS
10.0000 mg | ORAL_TABLET | Freq: Every day | ORAL | Status: DC
  Administered 2017-04-12: 10 mg via ORAL
  Filled 2017-04-10 (×2): qty 1

## 2017-04-10 MED ORDER — BENAZEPRIL HCL 20 MG PO TABS
40.0000 mg | ORAL_TABLET | Freq: Every day | ORAL | Status: DC
  Administered 2017-04-12: 40 mg via ORAL
  Filled 2017-04-10 (×2): qty 2

## 2017-04-10 NOTE — ED Triage Notes (Signed)
Pt states she has had abd pain with nausea, vomiting and some diarrhea since Wednesday

## 2017-04-10 NOTE — ED Notes (Signed)
Pt states she is unable to give a urine sample.  

## 2017-04-10 NOTE — ED Notes (Signed)
WILL TRANSPORT PT TO 2W 1235-1. AAOX4. PT IN NO APPARENT DISTRESS OR PAIN. IVF INFUSING W/O PAIN OR SWELLING. THE OPPORTUNITY TO ASK QUESTIONS WAS PROVIDED.

## 2017-04-10 NOTE — Progress Notes (Addendum)
Inpatient Diabetes Program Recommendations  AACE/ADA: New Consensus Statement on Inpatient Glycemic Control (2015)  Target Ranges:  Prepandial:   less than 140 mg/dL      Peak postprandial:   less than 180 mg/dL (1-2 hours)      Critically ill patients:  140 - 180 mg/dL   Review of Glycemic Control  Diabetes history: DM 2 Outpatient Diabetes medications: Victoza 1.8 mg daily, Amaryl 4mg  Daily, Invokana 300 mg Daily Current orders for Inpatient glycemic control: IV insulin  Last A1c 9.2% on 02/26/17  Inpatient Diabetes Program Recommendations:    Admitted with mild DKA. Patients Victoza has N/V side effects along with Invokana putting the patient at risk to develop DKA unless she is hydrated appropriately and could have exacerbated her symptoms, especially since her glucose was only in the mid 200's on admission the invokana could be the culprit. Also note she has not had these medications since Tuesday per MD note. Will touch base with patient within the next 24 hours. [facility]  Do not continue patient on Invokana at time of d/c without her following up with her PCP.   Patient's Last BMET indicates acidosis still with a CO2 of 13, Glucose levels 172 at the time.  When it is appropriate to transition the patient to SQ insulin, consider Lantus 20 units, Novolog Sensitive + HS if diet is resumed and patient is eating.  Thanks,  Christena DeemShannon Tarig Zimmers RN, MSN, [facility] Inpatient Diabetes Coordinator Team Pager [phone] (8a-5p)

## 2017-04-10 NOTE — H&P (Addendum)
History and Physical    Rita Hogan NWG:[id number] DOB: 30-Apr-1986 DOA: 04/10/2017  Referring MD/NP/PA: Mancel Bale PCP: Myrlene Broker, MD   Patient coming from: home  Chief Complaint: nausea, vomiting, diarrhea  HPI: Rita Hogan is a 31 y.o. female with history of obesity, hypertension, hyperlipidemia, diabetes mellitus type 2, depression and anxiety, and asthma who presents with nausea, vomiting, and diarrhea. The patient states that 3 days prior to admission she developed watery diarrhea that occurred "countless" times per day. She denies blood, tarriness, or mucus in her stools.  She has also had nausea and vomiting productive of bilious, nonbloody emesis which has been getting progressively worse over the last 24 hours. She also developed some bilateral upper quadrant abdominal cramping which has become severe. It comes and goes and is worse with eating.  She denies radiation to her back.  She had fevers and chills, temperature up to 100.7 Fahrenheit at home however her temperatures have come down over the last day or 2. She has had some sinus congestion in the emergency department but did not notice that prior. She denies cough, shortness of breath, dysuria. She has not been able to take any of her medications except for trazodone since Tuesday.  She took Imodium this morning which has slowed down her diarrhea.  ED Course: In the emergency department, her vital signs were stable. Labs notable for hemoglobin initially of 17.6 which came down to 13.6 after 3 L of IV fluids. Her initial sodium was 129 and increased to 135 again after IV fluids. She had an anion gap of 16 and a bicarbonate of 12 with a blood sugar of 261. Urinalysis demonstrated blood but she is having her period now and contaminated sample. No chest x-ray performed because she is not having respiratory symptoms. Liver function tests and lipase were within normal limits.  She was given 3 and half liters of IV  fluids, Zofran, Toradol, fentanyl, and started on glucose stabilizer in the emergency department.  Review of Systems:  General:  Positive fevers, chills, 14-pound weight loss in the last couple of weeks HEENT:  Denies changes to hearing and vision, rhinorrhea, sore throat CV:  Denies chest pain and palpitations, lower extremity edema.  PULM:  Denies SOB, wheezing, cough.   GI:  Per history of present illness  GU:  Denies dysuria, frequency, urgency ENDO:  Denies polyuria, polydipsia.   HEME:  Denies hematemesis, blood in stools, melena, abnormal bruising or bleeding.  LYMPH:  Denies lymphadenopathy.   MSK:  Denies arthralgias, myalgias.   DERM:  Denies skin rash or ulcer.   NEURO:  Denies focal numbness, weakness, slurred speech, confusion, facial droop.  PSYCH:  Denies anxiety and depression.    Past Medical History:  Diagnosis Date  . Allergy   . Anxiety   . Asthma   . Depression   . Diabetes mellitus   . Heart murmur   . Hyperlipidemia   . Hypertension   . Morbid obesity (HCC)     Past Surgical History:  Procedure Laterality Date  . EYE SURGERY    . PREPATELLAR BURSA EXCISION     right     reports that she has been smoking.  She has quit using smokeless tobacco. She reports that she does not drink alcohol or use drugs.  Allergies  Allergen Reactions  . Metformin And Related Diarrhea and Nausea And Vomiting    Pt requests that adverse reaction be reported for high doses of Metformin  . Other  Walnuts  . Oxycodone     Trouble breathing    Family History  Problem Relation Age of Onset  . Adopted: Yes    Prior to Admission medications   Medication Sig Start Date End Date Taking? Authorizing Provider  benazepril (LOTENSIN) 40 MG tablet Take 1 tablet (40 mg total) by mouth daily. 02/26/17  Yes Myrlene Brokerrawford, Elizabeth A, MD  buPROPion (WELLBUTRIN XL) 300 MG 24 hr tablet TAKE 1 TABLET(300 MG) BY MOUTH DAILY 02/26/17  Yes Myrlene Brokerrawford, Elizabeth A, MD  canagliflozin  [facility](INVOKANA) 300 MG TABS tablet Take 1 tablet (300 mg total) by mouth daily before breakfast. Reported on 12/24/2015 02/26/17  Yes Myrlene Brokerrawford, Elizabeth A, MD  cetirizine (ZYRTEC) 10 MG tablet Take 1 tablet (10 mg total) by mouth daily. 08/04/16  Yes Myrlene Brokerrawford, Elizabeth A, MD  citalopram (CELEXA) 40 MG tablet TAKE 1 TABLET(40 MG) BY MOUTH DAILY 02/26/17  Yes Myrlene Brokerrawford, Elizabeth A, MD  fluconazole (DIFLUCAN) 150 MG tablet Take 1 tablet (150 mg total) by mouth every 3 (three) days. 07/21/16  Yes Myrlene Brokerrawford, Elizabeth A, MD  glimepiride (AMARYL) 4 MG tablet Take 1 tablet (4 mg total) by mouth daily with breakfast. 02/26/17  Yes Myrlene Brokerrawford, Elizabeth A, MD  glucose blood (ACCU-CHEK AVIVA PLUS) test strip 1 each by Other route 2 (two) times daily. Use to check blood sugar twice a day. Dx E11.9 08/27/16  Yes Myrlene Brokerrawford, Elizabeth A, MD  hydrOXYzine (VISTARIL) 25 MG capsule TAKE 2 CAPSULES(50 MG) BY MOUTH EVERY 6 HOURS AS NEEDED FOR ANXIETY 03/12/17  Yes Myrlene Brokerrawford, Elizabeth A, MD  Insulin Pen Needle (SURE COMFORT PEN NEEDLES) 30G X 8 MM MISC Use for injections 08/04/16  Yes Myrlene Brokerrawford, Elizabeth A, MD  liraglutide (VICTOZA) 18 MG/3ML SOPN ADMINISTER 1.8 MG UNDER THE SKIN EVERY DAY 02/26/17  Yes Myrlene Brokerrawford, Elizabeth A, MD  montelukast (SINGULAIR) 10 MG tablet Take 1 tablet (10 mg total) by mouth daily. 04/15/16  Yes Myrlene Brokerrawford, Elizabeth A, MD  rosuvastatin (CRESTOR) 20 MG tablet Take 1 tablet (20 mg total) by mouth daily. Reported on 12/24/2015 04/15/16  Yes Myrlene Brokerrawford, Elizabeth A, MD  traZODone (DESYREL) 50 MG tablet Take 1.5 tablets (75 mg total) by mouth at bedtime as needed for sleep. 02/26/17  Yes Myrlene Brokerrawford, Elizabeth A, MD  triamcinolone cream (KENALOG) 0.1 % Apply 1 application topically 2 (two) times daily. Patient taking differently: Apply 1 application topically 2 (two) times daily as needed (eczema).  10/21/16  Yes Myrlene Brokerrawford, Elizabeth A, MD  amoxicillin-clavulanate (AUGMENTIN) 875-125 MG tablet Take 1 tablet by mouth every 12  (twelve) hours. Patient not taking: Reported on 04/10/2017 02/19/17   Hedges, Tinnie GensJeffrey, PA-C  traMADol (ULTRAM) 50 MG tablet Take 1 tablet (50 mg total) by mouth every 6 (six) hours as needed. Patient not taking: Reported on 02/26/2017 02/19/17   Eyvonne MechanicHedges, Jeffrey, PA-C    Physical Exam: Vitals:   04/10/17 0426 04/10/17 0644 04/10/17 0700 04/10/17 0914  BP:  138/87 130/83 (!) 136/93  Pulse:  84 87 87  Resp:  18  16  Temp:    97.8 F (36.6 C)  TempSrc:    Oral  SpO2:  99% 98% 100%  Weight: 111.2 kg (245 lb 4 oz)     Height: 5\' 1"  (1.549 m)       Constitutional: NAD, Sitting up in bed leaning over an emesis bag  Eyes: PERRL, lids and conjunctivae normal ENMT: Mucous membranes are moist. Mild erythema of the posterior soft palate, tonsils and posterior pharynx without purulence, exudates, oral sores  Neck: normal, supple, no masses, no thyromegaly Respiratory: clear to auscultation bilaterally, no wheezing, no crackles. Normal respiratory effort. No accessory muscle use.  Cardiovascular: Regular rate and rhythm, no murmurs / rubs / gallops. No extremity edema. 2+ pedal pulses. No carotid bruits.  Abdomen: Hyperactive bowel sounds, soft, nondistended, minimal tenderness to palpation in the bilateral lower quadrants without rebound or guarding  Musculoskeletal: no clubbing / cyanosis. No joint deformity upper and lower extremities. Good ROM, no contractures. Normal muscle tone.  Skin: no rashes, lesions, ulcers. No induration Neurologic: CN 2-12 grossly intact. Sensation intact, DTR normal. Strength 5/5 in all 4.  Psychiatric: Normal judgment and insight. Alert and oriented x 3. Normal mood.   Labs on Admission: I have personally reviewed following labs and imaging studies  CBC:  Recent Labs Lab 04/10/17 0428 04/10/17 0938  WBC 5.4  --   HGB 17.6* 13.6  HCT 48.5* 40.0  MCV 80.8  --   PLT 308  --    Basic Metabolic Panel:  Recent Labs Lab 04/10/17 0428 04/10/17 0938  NA 129* 135    K 3.7 3.4*  CL 101 107  CO2 12*  --   GLUCOSE 261* 215*  BUN 14 15  CREATININE 0.71 0.30*  CALCIUM 8.7*  --    GFR: Estimated Creatinine Clearance: 118.8 mL/min (A) (by C-G formula based on SCr of 0.3 mg/dL (L)). Liver Function Tests:  Recent Labs Lab 04/10/17 0428  AST 22  ALT 27  ALKPHOS 67  BILITOT 1.0  PROT 7.9  ALBUMIN 3.8    Recent Labs Lab 04/10/17 0428  LIPASE 21   No results for input(s): AMMONIA in the last 168 hours. Coagulation Profile: No results for input(s): INR, PROTIME in the last 168 hours. Cardiac Enzymes: No results for input(s): CKTOTAL, CKMB, CKMBINDEX, TROPONINI in the last 168 hours. BNP (last 3 results) No results for input(s): PROBNP in the last 8760 hours. HbA1C: No results for input(s): HGBA1C in the last 72 hours. CBG: No results for input(s): GLUCAP in the last 168 hours. Lipid Profile: No results for input(s): CHOL, HDL, LDLCALC, TRIG, CHOLHDL, LDLDIRECT in the last 72 hours. Thyroid Function Tests: No results for input(s): TSH, T4TOTAL, FREET4, T3FREE, THYROIDAB in the last 72 hours. Anemia Panel: No results for input(s): VITAMINB12, FOLATE, FERRITIN, TIBC, IRON, RETICCTPCT in the last 72 hours. Urine analysis:    Component Value Date/Time   COLORURINE YELLOW 04/10/2017 0802   APPEARANCEUR HAZY (A) 04/10/2017 0802   LABSPEC 1.029 04/10/2017 0802   PHURINE 6.0 04/10/2017 0802   GLUCOSEU >=500 (A) 04/10/2017 0802   HGBUR LARGE (A) 04/10/2017 0802   BILIRUBINUR NEGATIVE 04/10/2017 0802   KETONESUR 80 (A) 04/10/2017 0802   PROTEINUR 30 (A) 04/10/2017 0802   UROBILINOGEN 0.2 04/27/2015 0601   NITRITE NEGATIVE 04/10/2017 0802   LEUKOCYTESUR NEGATIVE 04/10/2017 0802   Sepsis Labs: @LABRCNTIP (procalcitonin:4,lacticidven:4) )No results found for this or any previous visit (from the past 240 hour(s)).   Radiological Exams on Admission: No results found.  Assessment/Plan Active Problems:   DKA (diabetic ketoacidoses) (HCC)    Nausea, vomiting, diarrhea. She has been exposed antibiotics in the last 3 months and is at risk for C. difficile diarrhea. I suspect that she developed a viral gastroenteritis however.  She does not have localizing symptoms/peritoneal signs to suggest appendicitis, pancreatitis, diverticulitis, or cholecystitis. -  GI pathogen panel -  C. difficile PCR -  Enteric precautions -  IV fluids -  Clear liquids diet -  Zofran, Reglan as needed for nausea -  Toradol, K pad for abdominal pain  Mild DKA in setting of uncontrolled diabetes mellitus type 2.  Her anion gap is not particularly elevated and I suspect that some of her low CO2 UA secondary to her copious diarrhea she has had. She states, however, that she has had insulin resistance and required U5 100 in the past. I will be cautious and start her on glucose stabilizer until her bicarbonate starts to rise in her anion gap is closed. -  Start D5 half-normal saline with potassium as blood sugars are less than 250 -  Insulin drip -  Every 4 hours BMP -  Hold outpatient diabetes medications -  Last A1c was 9.2 on 4/20 -  Diabetic educator -  Nutrition consultation  Mild hypokalemia -  IV potassium repletion since unable to tolerate PO -  Add potassium to IVF  Essential hypertension, blood pressure stable despite not taking her ACE inhibitor recently -  Will not give ACE inhibitor today and resume tomorrow  Depression and anxiety, stable -  Continue Wellbutrin, Celexa, trazodone  Allergic rhinitis, sinus congestion -  Continue cetirizine, Singulair  DVT prophylaxis: Lovenox  Code Status: Full code Family Communication: Patient alone  Disposition Plan: Likely home in 1-2 days  Consults called: None  Admission status: Observation, stepdown. I anticipate that her gap closed quickly and when it does her abdominal cramping and nausea, vomiting will start to improve enough that she can tolerate by mouth and resume her home medications. She  is at risk for decompensation/complications secondary to her diabetic ketoacidosis, hypokalemia.    Renae Fickle MD [facility] Pager [phone]  If 7PM-7AM, please contact night-coverage [email or web address] Password [facility]  04/10/2017, 10:36 AM

## 2017-04-10 NOTE — ED Provider Notes (Signed)
[facility]-EMERGENCY DEPT Provider Note   CSN: [id number] Arrival date & time: 04/10/17  0411     History   Chief Complaint Chief Complaint  Patient presents with  . Abdominal Pain    HPI Rita Hogan is a 31 y.o. female.  She presents for evaluation of abdominal pain, associated with nausea, vomiting and diarrhea.  Onset of symptoms several days ago, worsening despite clear liquid diet.  She denies fever, blood in emesis or stool.  No known sick contacts.  There are no other known modifying factors.  HPI  Past Medical History:  Diagnosis Date  . Allergy   . Anxiety   . Asthma   . Depression   . Diabetes mellitus   . Heart murmur   . Hyperlipidemia   . Hypertension   . Morbid obesity [place](HCC)     Patient Active Problem List   Diagnosis Date Noted  . Cat bite 02/20/2017  . Bite wound of right hand 02/20/2017  . Major depression, recurrent (HCC) 07/21/2016  . Essential hypertension 12/24/2015  . Morbid obesity (HCC) 12/24/2015  . Hidradenitis suppurativa 12/24/2015  . Type 2 diabetes mellitus (HCC) 07/09/2011    Past Surgical History:  Procedure Laterality Date  . EYE SURGERY    . PREPATELLAR BURSA EXCISION     right    OB History    Gravida Para Term Preterm AB Living   0             SAB TAB Ectopic Multiple Live Births                   Home Medications    Prior to Admission medications   Medication Sig Start Date End Date Taking? Authorizing Provider  benazepril (LOTENSIN) 40 MG tablet Take 1 tablet (40 mg total) by mouth daily. 02/26/17  Yes Myrlene Brokerrawford, Elizabeth A, MD  buPROPion (WELLBUTRIN XL) 300 MG 24 hr tablet TAKE 1 TABLET(300 MG) BY MOUTH DAILY 02/26/17  Yes Myrlene Brokerrawford, Elizabeth A, MD  canagliflozin [facility](INVOKANA) 300 MG TABS tablet Take 1 tablet (300 mg total) by mouth daily before breakfast. Reported on 12/24/2015 02/26/17  Yes Myrlene Brokerrawford, Elizabeth A, MD  cetirizine (ZYRTEC) 10 MG tablet Take 1 tablet (10 mg total) by mouth daily. 08/04/16  Yes Myrlene Brokerrawford,  Elizabeth A, MD  citalopram (CELEXA) 40 MG tablet TAKE 1 TABLET(40 MG) BY MOUTH DAILY 02/26/17  Yes Myrlene Brokerrawford, Elizabeth A, MD  fluconazole (DIFLUCAN) 150 MG tablet Take 1 tablet (150 mg total) by mouth every 3 (three) days. 07/21/16  Yes Myrlene Brokerrawford, Elizabeth A, MD  glimepiride (AMARYL) 4 MG tablet Take 1 tablet (4 mg total) by mouth daily with breakfast. 02/26/17  Yes Myrlene Brokerrawford, Elizabeth A, MD  glucose blood (ACCU-CHEK AVIVA PLUS) test strip 1 each by Other route 2 (two) times daily. Use to check blood sugar twice a day. Dx E11.9 08/27/16  Yes Myrlene Brokerrawford, Elizabeth A, MD  hydrOXYzine (VISTARIL) 25 MG capsule TAKE 2 CAPSULES(50 MG) BY MOUTH EVERY 6 HOURS AS NEEDED FOR ANXIETY 03/12/17  Yes Myrlene Brokerrawford, Elizabeth A, MD  Insulin Pen Needle (SURE COMFORT PEN NEEDLES) 30G X 8 MM MISC Use for injections 08/04/16  Yes Myrlene Brokerrawford, Elizabeth A, MD  liraglutide (VICTOZA) 18 MG/3ML SOPN ADMINISTER 1.8 MG UNDER THE SKIN EVERY DAY 02/26/17  Yes Myrlene Brokerrawford, Elizabeth A, MD  montelukast (SINGULAIR) 10 MG tablet Take 1 tablet (10 mg total) by mouth daily. 04/15/16  Yes Myrlene Brokerrawford, Elizabeth A, MD  rosuvastatin (CRESTOR) 20 MG tablet Take 1 tablet (20 mg total)  by mouth daily. Reported on 12/24/2015 04/15/16  Yes Myrlene Broker, MD  traZODone (DESYREL) 50 MG tablet Take 1.5 tablets (75 mg total) by mouth at bedtime as needed for sleep. 02/26/17  Yes Myrlene Broker, MD  triamcinolone cream (KENALOG) 0.1 % Apply 1 application topically 2 (two) times daily. Patient taking differently: Apply 1 application topically 2 (two) times daily as needed (eczema).  10/21/16  Yes Myrlene Broker, MD  amoxicillin-clavulanate (AUGMENTIN) 875-125 MG tablet Take 1 tablet by mouth every 12 (twelve) hours. Patient not taking: Reported on 04/10/2017 02/19/17   Hedges, Tinnie Gens, PA-C  traMADol (ULTRAM) 50 MG tablet Take 1 tablet (50 mg total) by mouth every 6 (six) hours as needed. Patient not taking: Reported on 02/26/2017 02/19/17   Eyvonne Mechanic, PA-C    Family History Family History  Problem Relation Age of Onset  . Adopted: Yes    Social History Social History  Substance Use Topics  . Smoking status: Current Some Day Smoker    Last attempt to quit: 07/13/2012  . Smokeless tobacco: Former Neurosurgeon  . Alcohol use No     Comment: rare     Allergies   Metformin and related; Other; and Oxycodone   Review of Systems Review of Systems  All other systems reviewed and are negative.    Physical Exam Updated Vital Signs BP (!) 136/93 (BP Location: Right Arm)   Pulse 87   Temp 97.8 F (36.6 C) (Oral)   Resp 16   Ht 5\' 1"  (1.549 m)   Wt 111.2 kg (245 lb 4 oz)   LMP 04/08/2017 (Exact Date)   SpO2 100%   BMI 46.34 kg/m   Physical Exam  Constitutional: She is oriented to person, place, and time. She appears well-developed. She appears distressed (She is uncomfortable).  Obese  HENT:  Head: Normocephalic and atraumatic.  Eyes: Conjunctivae and EOM are normal. Pupils are equal, round, and reactive to light.  Neck: Normal range of motion and phonation normal. Neck supple.  Cardiovascular: Normal rate and regular rhythm.   Pulmonary/Chest: Effort normal and breath sounds normal. She exhibits no tenderness.  Abdominal: Soft. She exhibits no distension. There is no tenderness. There is no guarding. No hernia.  Musculoskeletal: Normal range of motion.  Neurological: She is alert and oriented to person, place, and time. She exhibits normal muscle tone.  Skin: Skin is warm and dry.  Psychiatric: She has a normal mood and affect. Her behavior is normal. Judgment and thought content normal.  Nursing note and vitals reviewed.    ED Treatments / Results  Labs (all labs ordered are listed, but only abnormal results are displayed) Labs Reviewed  COMPREHENSIVE METABOLIC PANEL - Abnormal; Notable for the following:       Result Value   Sodium 129 (*)    CO2 12 (*)    Glucose, Bld 261 (*)    Calcium 8.7 (*)    Anion  gap 16 (*)    All other components within normal limits  CBC - Abnormal; Notable for the following:    RBC 6.00 (*)    Hemoglobin 17.6 (*)    HCT 48.5 (*)    MCHC 36.3 (*)    All other components within normal limits  URINALYSIS, ROUTINE W REFLEX MICROSCOPIC - Abnormal; Notable for the following:    APPearance HAZY (*)    Glucose, UA >=500 (*)    Hgb urine dipstick LARGE (*)    Ketones, ur 80 (*)  Protein, ur 30 (*)    Squamous Epithelial / LPF 0-5 (*)    All other components within normal limits  I-STAT CHEM 8, ED - Abnormal; Notable for the following:    Potassium 3.4 (*)    Creatinine, Ser 0.30 (*)    Glucose, Bld 215 (*)    Calcium, Ion 1.09 (*)    All other components within normal limits  LIPASE, BLOOD  I-STAT BETA HCG BLOOD, ED ([facility], [facility], [facility] ONLY)    EKG  EKG Interpretation None       Radiology No results found.  Procedures Procedures (including critical care time)  Medications Ordered in ED Medications  fentaNYL (SUBLIMAZE) injection 50 mcg (not administered)  sodium chloride 0.9 % bolus 500 mL (not administered)  0.9 %  sodium chloride infusion (not administered)  sodium chloride 0.9 % bolus 2,000 mL (0 mLs Intravenous Stopped 04/10/17 0804)  ondansetron (ZOFRAN) injection 4 mg (4 mg Intravenous Given 04/10/17 0536)  ketorolac (TORADOL) 30 MG/ML injection 30 mg (30 mg Intravenous Given 04/10/17 0536)  sodium chloride 0.9 % bolus 1,000 mL (1,000 mLs Intravenous New Bag/Given 04/10/17 0807)  ondansetron (ZOFRAN) injection 4 mg (4 mg Intravenous Given 04/10/17 0915)     Initial Impression / Assessment and Plan / ED Course  I have reviewed the triage vital signs and the nursing notes.  Pertinent labs & imaging results that were available during my care of the patient were reviewed by me and considered in my medical decision making (see chart for details).      Patient Vitals for the past 24 hrs:  BP Temp Temp src Pulse Resp SpO2 Height Weight  04/10/17 0914  (!) 136/93 97.8 F (36.6 C) Oral 87 16 100 % - -  04/10/17 0700 130/83 - - 87 - 98 % - -  04/10/17 0644 138/87 - - 84 18 99 % - -  04/10/17 0426 - - - - - - 5\' 1"  (1.549 m) 111.2 kg (245 lb 4 oz)  04/10/17 0425 129/90 98 F (36.7 C) Oral 86 16 100 % - -    9:58 AM-Reevaluation with update and discussion. After initial assessment and treatment, an updated evaluation reveals she continues to be uncomfortable.  Findings discussed with the patient all questions answered. Kirston Luty L   9:49 AM-Consult complete with hospitalist. Patient case explained and discussed.  She agrees to admit patient for further evaluation and treatment. Call ended at 9:59 AM  Final Clinical Impressions(s) / ED Diagnoses   Final diagnoses:  Nausea vomiting and diarrhea  Dehydration    Nausea vomiting diarrhea, with glycemia, and low CO2.  Anion gap elevated, 16.  Repeat anion gap 14.  Patient is type II diabetic, unclear if this represents DKA versus hyperosmolar state.  She will require admission for stabilization  Nursing Notes Reviewed/ Care Coordinated Applicable Imaging Reviewed Interpretation of Laboratory Data incorporated into ED treatment  Plan: Admit  New Prescriptions New Prescriptions   No medications on file     Mancel Bale, MD 04/10/17 1000

## 2017-04-11 DIAGNOSIS — F329 Major depressive disorder, single episode, unspecified: Secondary | ICD-10-CM | POA: Diagnosis present

## 2017-04-11 DIAGNOSIS — E669 Obesity, unspecified: Secondary | ICD-10-CM | POA: Diagnosis present

## 2017-04-11 DIAGNOSIS — J45909 Unspecified asthma, uncomplicated: Secondary | ICD-10-CM | POA: Diagnosis present

## 2017-04-11 DIAGNOSIS — K529 Noninfective gastroenteritis and colitis, unspecified: Secondary | ICD-10-CM

## 2017-04-11 DIAGNOSIS — E876 Hypokalemia: Secondary | ICD-10-CM | POA: Diagnosis not present

## 2017-04-11 DIAGNOSIS — Z79899 Other long term (current) drug therapy: Secondary | ICD-10-CM | POA: Diagnosis not present

## 2017-04-11 DIAGNOSIS — E1165 Type 2 diabetes mellitus with hyperglycemia: Secondary | ICD-10-CM | POA: Diagnosis not present

## 2017-04-11 DIAGNOSIS — F419 Anxiety disorder, unspecified: Secondary | ICD-10-CM | POA: Diagnosis present

## 2017-04-11 DIAGNOSIS — E860 Dehydration: Secondary | ICD-10-CM | POA: Diagnosis not present

## 2017-04-11 DIAGNOSIS — E1110 Type 2 diabetes mellitus with ketoacidosis without coma: Secondary | ICD-10-CM | POA: Diagnosis not present

## 2017-04-11 DIAGNOSIS — Z888 Allergy status to other drugs, medicaments and biological substances status: Secondary | ICD-10-CM | POA: Diagnosis not present

## 2017-04-11 DIAGNOSIS — I10 Essential (primary) hypertension: Secondary | ICD-10-CM | POA: Diagnosis present

## 2017-04-11 DIAGNOSIS — E785 Hyperlipidemia, unspecified: Secondary | ICD-10-CM | POA: Diagnosis present

## 2017-04-11 DIAGNOSIS — Z6841 Body Mass Index (BMI) 40.0 and over, adult: Secondary | ICD-10-CM | POA: Diagnosis not present

## 2017-04-11 DIAGNOSIS — Z87891 Personal history of nicotine dependence: Secondary | ICD-10-CM | POA: Diagnosis not present

## 2017-04-11 LAB — GLUCOSE, CAPILLARY
GLUCOSE-CAPILLARY: 179 mg/dL — AB (ref 65–99)
GLUCOSE-CAPILLARY: 150 mg/dL — AB (ref 65–99)
GLUCOSE-CAPILLARY: 161 mg/dL — AB (ref 65–99)
GLUCOSE-CAPILLARY: 157 mg/dL — AB (ref 65–99)
GLUCOSE-CAPILLARY: 155 mg/dL — AB (ref 65–99)
GLUCOSE-CAPILLARY: 163 mg/dL — AB (ref 65–99)
GLUCOSE-CAPILLARY: 138 mg/dL — AB (ref 65–99)
GLUCOSE-CAPILLARY: 155 mg/dL — AB (ref 65–99)
GLUCOSE-CAPILLARY: 232 mg/dL — AB (ref 65–99)
GLUCOSE-CAPILLARY: 118 mg/dL — AB (ref 65–99)
GLUCOSE-CAPILLARY: 144 mg/dL — AB (ref 65–99)
Glucose-Capillary: 108 mg/dL — ABNORMAL HIGH (ref 65–99)
Glucose-Capillary: 129 mg/dL — ABNORMAL HIGH (ref 65–99)
Glucose-Capillary: 143 mg/dL — ABNORMAL HIGH (ref 65–99)
Glucose-Capillary: 148 mg/dL — ABNORMAL HIGH (ref 65–99)
Glucose-Capillary: 148 mg/dL — ABNORMAL HIGH (ref 65–99)
Glucose-Capillary: 150 mg/dL — ABNORMAL HIGH (ref 65–99)
Glucose-Capillary: 158 mg/dL — ABNORMAL HIGH (ref 65–99)
Glucose-Capillary: 163 mg/dL — ABNORMAL HIGH (ref 65–99)
Glucose-Capillary: 186 mg/dL — ABNORMAL HIGH (ref 65–99)
Glucose-Capillary: 189 mg/dL — ABNORMAL HIGH (ref 65–99)
Glucose-Capillary: 215 mg/dL — ABNORMAL HIGH (ref 65–99)

## 2017-04-11 LAB — BASIC METABOLIC PANEL
Anion gap: 6 (ref 5–15)
Anion gap: 6 (ref 5–15)
Anion gap: 7 (ref 5–15)
Anion gap: 8 (ref 5–15)
BUN: 11 mg/dL (ref 6–20)
BUN: 12 mg/dL (ref 6–20)
BUN: 13 mg/dL (ref 6–20)
BUN: 9 mg/dL (ref 6–20)
CALCIUM: 7.9 mg/dL — AB (ref 8.9–10.3)
CALCIUM: 8.1 mg/dL — AB (ref 8.9–10.3)
CALCIUM: 8.1 mg/dL — AB (ref 8.9–10.3)
CHLORIDE: 116 mmol/L — AB (ref 101–111)
CO2: 14 mmol/L — AB (ref 22–32)
CO2: 15 mmol/L — ABNORMAL LOW (ref 22–32)
CO2: 15 mmol/L — ABNORMAL LOW (ref 22–32)
CO2: 17 mmol/L — AB (ref 22–32)
CREATININE: 0.42 mg/dL — AB (ref 0.44–1.00)
CREATININE: 0.42 mg/dL — AB (ref 0.44–1.00)
CREATININE: 0.32 mg/dL — AB (ref 0.44–1.00)
Calcium: 8.1 mg/dL — ABNORMAL LOW (ref 8.9–10.3)
Chloride: 110 mmol/L (ref 101–111)
Chloride: 114 mmol/L — ABNORMAL HIGH (ref 101–111)
Chloride: 115 mmol/L — ABNORMAL HIGH (ref 101–111)
Creatinine, Ser: 0.33 mg/dL — ABNORMAL LOW (ref 0.44–1.00)
GFR calc non Af Amer: >60 mL/min (ref >60)
GFR calc non Af Amer: >60 mL/min (ref >60)
GFR calc non Af Amer: >60 mL/min (ref >60)
GLUCOSE: 150 mg/dL — AB (ref 65–99)
Glucose, Bld: 167 mg/dL — ABNORMAL HIGH (ref 65–99)
Glucose, Bld: 168 mg/dL — ABNORMAL HIGH (ref 65–99)
Glucose, Bld: 198 mg/dL — ABNORMAL HIGH (ref 65–99)
Potassium: 3.1 mmol/L — ABNORMAL LOW (ref 3.5–5.1)
Potassium: 3.2 mmol/L — ABNORMAL LOW (ref 3.5–5.1)
Potassium: 3.2 mmol/L — ABNORMAL LOW (ref 3.5–5.1)
Potassium: 3.4 mmol/L — ABNORMAL LOW (ref 3.5–5.1)
SODIUM: 133 mmol/L — AB (ref 135–145)
SODIUM: 136 mmol/L (ref 135–145)
SODIUM: 135 mmol/L (ref 135–145)
Sodium: 139 mmol/L (ref 135–145)

## 2017-04-11 MED ORDER — DICYCLOMINE HCL 10 MG PO CAPS
10.0000 mg | ORAL_CAPSULE | Freq: Four times a day (QID) | ORAL | Status: DC
  Administered 2017-04-11 (×2): 10 mg via ORAL
  Filled 2017-04-11 (×5): qty 1

## 2017-04-11 MED ORDER — POTASSIUM CHLORIDE CRYS ER 20 MEQ PO TBCR
40.0000 meq | EXTENDED_RELEASE_TABLET | Freq: Once | ORAL | Status: AC
  Administered 2017-04-11: 40 meq via ORAL
  Filled 2017-04-11: qty 2

## 2017-04-11 MED ORDER — HYDRALAZINE HCL 20 MG/ML IJ SOLN
10.0000 mg | Freq: Four times a day (QID) | INTRAMUSCULAR | Status: DC | PRN
  Administered 2017-04-11: 10 mg via INTRAVENOUS
  Filled 2017-04-11: qty 1

## 2017-04-11 MED ORDER — ONDANSETRON HCL 4 MG/2ML IJ SOLN
4.0000 mg | Freq: Four times a day (QID) | INTRAMUSCULAR | Status: DC | PRN
  Administered 2017-04-11: 4 mg via INTRAVENOUS
  Filled 2017-04-11: qty 2

## 2017-04-11 MED ORDER — SODIUM CHLORIDE 0.9 % IV BOLUS (SEPSIS)
1000.0000 mL | Freq: Once | INTRAVENOUS | Status: AC
  Administered 2017-04-11: 1000 mL via INTRAVENOUS

## 2017-04-11 NOTE — Progress Notes (Signed)
Initial Nutrition Assessment  DOCUMENTATION CODES:   Morbid obesity  INTERVENTION:   Diet advancement per MD Will need DM diet education prior to discharge  NUTRITION DIAGNOSIS:   Inadequate oral intake related to nausea, vomiting as evidenced by per patient/family report.  GOAL:   Patient will meet greater than or equal to 90% of their needs  MONITOR:   PO intake, Labs, Weight trends, I & O's  REASON FOR ASSESSMENT:   Consult Assessment of nutrition requirement/status  ASSESSMENT:   31 y.o. female with history of obesity, hypertension, hyperlipidemia, diabetes mellitus type 2, depression and anxiety, and asthma who presents with nausea, vomiting, and diarrhea. The patient states that 3 days prior to admission she developed watery diarrhea that occurred "countless" times per day. She denies blood, tarriness, or mucus in her stools.  She has also had nausea and vomiting productive of bilious, nonbloody emesis which has been getting progressively worse over the last 24 hours. She also developed some bilateral upper quadrant abdominal cramping which has become severe. It comes and goes and is worse with eating.  She denies radiation to her back.  She had fevers and chills, temperature up to 100.7 Fahrenheit at home however her temperatures have come down over the last day or 2. She has had some sinus congestion in the emergency department but did not notice that prior. She denies cough, shortness of breath, dysuria. She has not been able to take any of her medications except for trazodone since Tuesday.  She took Imodium this morning which has slowed down her diarrhea.  Patient in room with no family at bedside. Pt reports that prior to developing N/V 3 days ago, she was eating normally with good appetite (3 meals/day). Pt states she has had diet education in the past since she has had diabetes for most of her life. Pt is open for a review of the diet prior to discharge. Pt c/o pain  today and education not appropriate at this time as pt is only on clear liquids a this time.   Per chart review, pt has lost 14 lb since 4/20 (5% wt loss x 1.5 months, insignificant for time frame). Nutrition focused physical exam shows no sign of depletion of muscle mass or body fat.  Medications: D5 and .45%NaCl infusion at 100 ml/hr -provides 408 kcal, IV Reglan PRN Labs reviewed: CBGs: 138-143 Low K  Diet Order:  Diet clear liquid Room service appropriate? Yes; Fluid consistency: Thin  Skin:  Reviewed, no issues  Last BM:  6/2  Height:   Ht Readings from Last 1 Encounters:  04/10/17 5\' 1"  (1.549 m)    Weight:   Wt Readings from Last 1 Encounters:  04/10/17 245 lb 4 oz (111.2 kg)    Ideal Body Weight:  47.7 kg  BMI:  Body mass index is 46.34 kg/m.  Estimated Nutritional Needs:   Kcal:  1600-1800  Protein:  60-80g  Fluid:  1.8L/day  EDUCATION NEEDS:   Education needs addressed  Tilda FrancoLindsey Tameron Lama, MS, RD, LDN Pager: [phone] After Hours Pager: [phone]

## 2017-04-11 NOTE — Progress Notes (Signed)
PT Cancellation Note  Patient Details Name: Rita Hogan MRN: [id number] DOB: [birth year]   Cancelled Treatment:    Reason Eval/Treat Not Completed: PT screened, no needs identified, will sign off   [facility] PaWILLIAMS,Kloie Whiting 04/11/2017, 1:53 PM

## 2017-04-11 NOTE — Progress Notes (Signed)
PROGRESS NOTE    Rita Bellinglisabeth Streiff   WUJ:[id number]  DOB: [birth year]  DOA: 04/10/2017 PCP: Myrlene Brokerrawford, Elizabeth A, MD   Brief Narrative:  Rita Hogan is a 31 y.o. female with history of obesity, hypertension, hyperlipidemia, diabetes mellitus type 2, depression and anxiety, and asthma who presents with nausea, vomiting, and diarrhea. The patient states that 3 days prior to admission she developed watery diarrhea which was severe associated with vomiting and low grade fever of 100.7. She has not been able to take her medications other than Trazodone.   Subjective: No complaints of vomiting today. Abdominal pain is sharp and comes and goes. No further diarrhea or fevers.   Assessment & Plan:   Principal Problem:   DKA (diabetic ketoacidoses)/ Type 2 diabetes mellitus  - cont Insulin infusion for now  Active Problems:   Gastroenteritis - clear liquids as tolerated - add Bentyl for abdominal cramping - cont IVF - she has not had any further stool to send a GI pathogen panel    Hypokalemia - due to diarrhea- cont to replace  Metabolic acidosis - due to DKA and diarrhea- improving   DVT prophylaxis: Lovenox Code Status: Full code Family Communication:  Disposition Plan: follow in SDU Consultants:    Procedures:    Antimicrobials:  Anti-infectives    None       Objective: Vitals:   04/11/17 0416 04/11/17 0521 04/11/17 0724 04/11/17 0800  BP:  (!) 171/88 131/77   Pulse:  77 82 70  Resp:  (!) 30 (!) 22 (!) 23  Temp: 98.5 F (36.9 C)   98.1 F (36.7 C)  TempSrc: Oral   Oral  SpO2:  98% 97% 98%  Weight:      Height:        Intake/Output Summary (Last 24 hours) at 04/11/17 1437 Last data filed at 04/11/17 1000  Gross per 24 hour  Intake          1867.06 ml  Output              900 ml  Net           967.06 ml   Filed Weights   04/10/17 0426  Weight: 111.2 kg (245 lb 4 oz)    Examination: General exam: Appears comfortable  HEENT: PERRLA, oral  mucosa moist, no sclera icterus or thrush Respiratory system: Clear to auscultation. Respiratory effort normal. Cardiovascular system: S1 & S2 heard, RRR.  No murmurs  Gastrointestinal system: Abdomen soft, tender in LLQ, nondistended. Normal bowel sound. No organomegaly Central nervous system: Alert and oriented. No focal neurological deficits. Extremities: No cyanosis, clubbing or edema Skin: No rashes or ulcers Psychiatry:  Mood & affect appropriate.     Data Reviewed: I have personally reviewed following labs and imaging studies  CBC:  Recent Labs Lab 04/10/17 0428 04/10/17 0938  WBC 5.4  --   HGB 17.6* 13.6  HCT 48.5* 40.0  MCV 80.8  --   PLT 308  --    Basic Metabolic Panel:  Recent Labs Lab 04/10/17 1905 04/10/17 2356 04/11/17 0353 04/11/17 0743 04/11/17 1219  NA 133* 133* 136 135 139  K 3.4* 3.2* 3.4* 3.2* 3.1*  CL 112* 110 115* 114* 116*  CO2 13* 15* 15* 14* 17*  GLUCOSE 177* 198* 167* 168* 150*  BUN 13 13 12 11 9   CREATININE 0.43* 0.42* 0.42* 0.32* 0.33*  CALCIUM 7.9* 8.1* 7.9* 8.1* 8.1*   GFR: Estimated Creatinine Clearance: 118.8 mL/min (A) (by C-G formula  based on SCr of 0.33 mg/dL (L)). Liver Function Tests:  Recent Labs Lab 04/10/17 0428  AST 22  ALT 27  ALKPHOS 67  BILITOT 1.0  PROT 7.9  ALBUMIN 3.8    Recent Labs Lab 04/10/17 0428  LIPASE 21   No results for input(s): AMMONIA in the last 168 hours. Coagulation Profile: No results for input(s): INR, PROTIME in the last 168 hours. Cardiac Enzymes: No results for input(s): CKTOTAL, CKMB, CKMBINDEX, TROPONINI in the last 168 hours. BNP (last 3 results) No results for input(s): PROBNP in the last 8760 hours. HbA1C: No results for input(s): HGBA1C in the last 72 hours. CBG:  Recent Labs Lab 04/11/17 0835 04/11/17 0945 04/11/17 1059 04/11/17 1211 04/11/17 1322  GLUCAP 163* 148* 138* 143* 150*   Lipid Profile: No results for input(s): CHOL, HDL, LDLCALC, TRIG, CHOLHDL,  LDLDIRECT in the last 72 hours. Thyroid Function Tests: No results for input(s): TSH, T4TOTAL, FREET4, T3FREE, THYROIDAB in the last 72 hours. Anemia Panel: No results for input(s): VITAMINB12, FOLATE, FERRITIN, TIBC, IRON, RETICCTPCT in the last 72 hours. Urine analysis:    Component Value Date/Time   COLORURINE YELLOW 04/10/2017 0802   APPEARANCEUR HAZY (A) 04/10/2017 0802   LABSPEC 1.029 04/10/2017 0802   PHURINE 6.0 04/10/2017 0802   GLUCOSEU >=500 (A) 04/10/2017 0802   HGBUR LARGE (A) 04/10/2017 0802   BILIRUBINUR NEGATIVE 04/10/2017 0802   KETONESUR 80 (A) 04/10/2017 0802   PROTEINUR 30 (A) 04/10/2017 0802   UROBILINOGEN 0.2 04/27/2015 0601   NITRITE NEGATIVE 04/10/2017 0802   LEUKOCYTESUR NEGATIVE 04/10/2017 0802   Sepsis Labs: @LABRCNTIP (procalcitonin:4,lacticidven:4) ) Recent Results (from the past 240 hour(s))  MRSA PCR Screening     Status: None   Collection Time: 04/10/17  6:36 PM  Result Value Ref Range Status   MRSA by PCR NEGATIVE NEGATIVE Final    Comment:        The GeneXpert MRSA Assay (FDA approved for NASAL specimens only), is one component of a comprehensive MRSA colonization surveillance program. It is not intended to diagnose MRSA infection nor to guide or monitor treatment for MRSA infections.          Radiology Studies: No results found.    Scheduled Meds: . benazepril  40 mg Oral Daily  . buPROPion  300 mg Oral Daily  . citalopram  40 mg Oral Daily  . dicyclomine  10 mg Oral QID  . enoxaparin (LOVENOX) injection  40 mg Subcutaneous Q24H  . insulin regular  0-10 Units Intravenous TID WC  . loratadine  10 mg Oral Daily  . mouth rinse  15 mL Mouth Rinse BID  . montelukast  10 mg Oral Daily  . rosuvastatin  20 mg Oral Daily   Continuous Infusions: . dextrose 5 % and 0.45 % NaCl with KCl 20 mEq/L 100 mL/hr at 04/11/17 1338  . insulin (NOVOLIN-R) infusion 3.6 Units/hr (04/11/17 1338)     LOS: 0 days    Time spent in minutes:  35    Calvert Cantor, MD [facility] Pager: [email or web address] Password [facility] 04/11/2017, 2:37 PM

## 2017-04-12 DIAGNOSIS — I10 Essential (primary) hypertension: Secondary | ICD-10-CM

## 2017-04-12 DIAGNOSIS — E1165 Type 2 diabetes mellitus with hyperglycemia: Secondary | ICD-10-CM

## 2017-04-12 LAB — GLUCOSE, CAPILLARY
GLUCOSE-CAPILLARY: 170 mg/dL — AB (ref 65–99)
GLUCOSE-CAPILLARY: 149 mg/dL — AB (ref 65–99)
GLUCOSE-CAPILLARY: 159 mg/dL — AB (ref 65–99)
GLUCOSE-CAPILLARY: 148 mg/dL — AB (ref 65–99)
GLUCOSE-CAPILLARY: 138 mg/dL — AB (ref 65–99)
Glucose-Capillary: 188 mg/dL — ABNORMAL HIGH (ref 65–99)
Glucose-Capillary: 185 mg/dL — ABNORMAL HIGH (ref 65–99)
Glucose-Capillary: 130 mg/dL — ABNORMAL HIGH (ref 65–99)
Glucose-Capillary: 145 mg/dL — ABNORMAL HIGH (ref 65–99)
Glucose-Capillary: 213 mg/dL — ABNORMAL HIGH (ref 65–99)
Glucose-Capillary: 243 mg/dL — ABNORMAL HIGH (ref 65–99)

## 2017-04-12 LAB — BASIC METABOLIC PANEL
ANION GAP: 7 (ref 5–15)
Anion gap: 6 (ref 5–15)
BUN: <5 mg/dL — ABNORMAL LOW (ref 6–20)
CALCIUM: 8.4 mg/dL — AB (ref 8.9–10.3)
CO2: 18 mmol/L — ABNORMAL LOW (ref 22–32)
CO2: 23 mmol/L (ref 22–32)
Calcium: 8.2 mg/dL — ABNORMAL LOW (ref 8.9–10.3)
Chloride: 114 mmol/L — ABNORMAL HIGH (ref 101–111)
Chloride: 108 mmol/L (ref 101–111)
Creatinine, Ser: 0.33 mg/dL — ABNORMAL LOW (ref 0.44–1.00)
Creatinine, Ser: 0.37 mg/dL — ABNORMAL LOW (ref 0.44–1.00)
GFR calc Af Amer: >60 mL/min (ref >60)
GLUCOSE: 139 mg/dL — AB (ref 65–99)
GLUCOSE: 228 mg/dL — AB (ref 65–99)
POTASSIUM: 3.6 mmol/L (ref 3.5–5.1)
POTASSIUM: 3.5 mmol/L (ref 3.5–5.1)
Sodium: 138 mmol/L (ref 135–145)
Sodium: 138 mmol/L (ref 135–145)

## 2017-04-12 LAB — GASTROINTESTINAL PANEL BY PCR, STOOL (REPLACES STOOL CULTURE)
ADENOVIRUS F40/41: NOT DETECTED
Astrovirus: DETECTED — AB
CYCLOSPORA CAYETANENSIS: NOT DETECTED
Campylobacter species: NOT DETECTED
Cryptosporidium: NOT DETECTED
ENTEROAGGREGATIVE E COLI (EAEC): NOT DETECTED
ENTEROPATHOGENIC E COLI (EPEC): NOT DETECTED
Entamoeba histolytica: NOT DETECTED
Enterotoxigenic E coli (ETEC): NOT DETECTED
GIARDIA LAMBLIA: NOT DETECTED
Norovirus GI/GII: NOT DETECTED
PLESIMONAS SHIGELLOIDES: DETECTED — AB
Rotavirus A: NOT DETECTED
SALMONELLA SPECIES: NOT DETECTED
Sapovirus (I, II, IV, and V): NOT DETECTED
Shiga like toxin producing E coli (STEC): NOT DETECTED
Shigella/Enteroinvasive E coli (EIEC): NOT DETECTED
VIBRIO SPECIES: NOT DETECTED
Vibrio cholerae: NOT DETECTED
YERSINIA ENTEROCOLITICA: NOT DETECTED

## 2017-04-12 LAB — MAGNESIUM: Magnesium: 1.8 mg/dL (ref 1.7–2.4)

## 2017-04-12 LAB — PHOSPHORUS: Phosphorus: 1.4 mg/dL — ABNORMAL LOW (ref 2.5–4.6)

## 2017-04-12 MED ORDER — INSULIN ASPART 100 UNIT/ML ~~LOC~~ SOLN
0.0000 [IU] | Freq: Three times a day (TID) | SUBCUTANEOUS | Status: DC
  Administered 2017-04-12: 5 [IU] via SUBCUTANEOUS

## 2017-04-12 MED ORDER — INSULIN ASPART 100 UNIT/ML ~~LOC~~ SOLN: 0.0000 [IU] | Freq: Every day | SUBCUTANEOUS | Status: DC

## 2017-04-12 MED ORDER — POTASSIUM CHLORIDE CRYS ER 20 MEQ PO TBCR
40.0000 meq | EXTENDED_RELEASE_TABLET | Freq: Once | ORAL | Status: AC
  Administered 2017-04-12: 40 meq via ORAL
  Filled 2017-04-12: qty 2

## 2017-04-12 MED ORDER — MAGNESIUM SULFATE 2 GM/50ML IV SOLN
2.0000 g | Freq: Once | INTRAVENOUS | Status: AC
  Administered 2017-04-12: 2 g via INTRAVENOUS
  Filled 2017-04-12: qty 50

## 2017-04-12 MED ORDER — LIRAGLUTIDE 18 MG/3ML ~~LOC~~ SOPN: 1.8000 mg | PEN_INJECTOR | Freq: Every morning | SUBCUTANEOUS | Status: DC

## 2017-04-12 MED ORDER — GLIMEPIRIDE 4 MG PO TABS
4.0000 mg | ORAL_TABLET | Freq: Every day | ORAL | Status: DC
  Administered 2017-04-12: 4 mg via ORAL
  Filled 2017-04-12: qty 1

## 2017-04-12 MED ORDER — DICYCLOMINE HCL 10 MG PO CAPS
10.0000 mg | ORAL_CAPSULE | Freq: Four times a day (QID) | ORAL | Status: DC | PRN
  Administered 2017-04-12: 10 mg via ORAL
  Filled 2017-04-12: qty 1

## 2017-04-12 MED ORDER — CANAGLIFLOZIN 100 MG PO TABS: 300.0000 mg | ORAL_TABLET | Freq: Every day | ORAL | Status: DC

## 2017-04-12 NOTE — Discharge Summary (Signed)
Physician Discharge Summary  Rita Hogan ZOX:[id number] DOB: 1986-10-21 DOA: 04/10/2017  PCP: Myrlene Broker, MD  Admit date: 04/10/2017 Discharge date: 04/12/2017  Admitted From: home Disposition:  home   Recommendations for Outpatient Follow-up:  1. F/u with Endocrine 2. Needs referral to weight loss/ obesity clinic  Discharge Condition:  stable   CODE STATUS:  Full code   Consultations:  none    Discharge Diagnoses:  Principal Problem:   DKA (diabetic ketoacidoses) (HCC) Active Problems:   Gastroenteritis   Type 2 diabetes mellitus (HCC)   Essential hypertension   Morbid obesity (HCC)   Hypokalemia    Subjective: Feels better today. No diarrhea overnight. She has had a small formed BM. No vomiting. Advanced to solid food which she is tolerating well without exacerbation of GI symptoms.   Brief Summary: Rita Hogan a 31 y.o.femalewith history of obesity, hypertension, hyperlipidemia, diabetes mellitus type 2, depression and anxiety, and asthma who presents with nausea, vomiting, and diarrhea. The patient states that 3 days prior to admission she developed watery diarrhea which was severe associated with vomiting and low grade fever of 100.7. She has not been able to take her medications other than Trazodone.   Hospital Course:  Principal Problem:   DKA (diabetic ketoacidoses)/ Type 2 diabetes mellitus  - likely due to gastroenteritis in setting of poor dietary control and Invokana use - resolved with Insulin infusion - lat A1c was 9- I did discuss starting insulin to get her to goal A1c and she mentioned that she is "insulin resistant" and has been on U 500 in the past which did not work for her. She also states that she gained a great deal of weight on Insulin - I have recommended an endocrine consult as I am doubtful that her A1c can get to goal without insulin. She is a high risk for complications as she has been a diabetic since a teenager and  according to the A1c's I see in our computer system, her Diabetes has never been adequately controlled.  - will resume her usual home meds for now - she does admit that she ran out of her Victoza about 3 wks ago and has not had the money to refill it. I have spoken with her about it today and she states that she is able to afford it and will have it filled upon discharge from the hospital.   Active Problems:   Gastroenteritis - she has not had any further watery stool to send a GI pathogen panel- small BM yesterday- stool was formed -diet advanced steadily to soft easy to digest which she is tolerating well  Morbid obesity Body mass index is 46.34 kg/m. - her weight (and dietary control) is a significant issue and needs to be addressed more aggressively along with better glucose control     Hypokalemia/ hypomagnesemia - due to diarrhea- cont to replace  Metabolic acidosis - due to DKA and diarrhea- has resolved  HTN - cont ACE- her BPs have been notably elevated during this hospital stay- she states that this is not common for her and at her baseline, her BP is usually well controlled- I have not made any modifications in her medication regimen in regards to this- can f/u as outpt in 1-2 wks for BP check  Discharge Instructions  Discharge Instructions    Diet - low sodium heart healthy    Complete by:  As directed    Diet Carb Modified    Complete by:  As  directed    Increase activity slowly    Complete by:  As directed      Allergies as of 04/12/2017      Reactions   Metformin And Related Diarrhea, Nausea And Vomiting   Pt requests that adverse reaction be reported for high doses of Metformin   Other    Walnuts   Oxycodone    Trouble breathing      Medication List    TAKE these medications   benazepril 40 MG tablet Commonly known as:  LOTENSIN Take 1 tablet (40 mg total) by mouth daily.   buPROPion 300 MG 24 hr tablet Commonly known as:  WELLBUTRIN XL TAKE 1  TABLET(300 MG) BY MOUTH DAILY   canagliflozin 300 MG Tabs tablet Commonly known as:  INVOKANA Take 1 tablet (300 mg total) by mouth daily before breakfast. Reported on 12/24/2015   cetirizine 10 MG tablet Commonly known as:  ZYRTEC Take 1 tablet (10 mg total) by mouth daily.   citalopram 40 MG tablet Commonly known as:  CELEXA TAKE 1 TABLET(40 MG) BY MOUTH DAILY   fluconazole 150 MG tablet Commonly known as:  DIFLUCAN Take 1 tablet (150 mg total) by mouth every 3 (three) days.   glimepiride 4 MG tablet Commonly known as:  AMARYL Take 1 tablet (4 mg total) by mouth daily with breakfast.   glucose blood test strip Commonly known as:  ACCU-CHEK AVIVA PLUS 1 each by Other route 2 (two) times daily. Use to check blood sugar twice a day. Dx E11.9   hydrOXYzine 25 MG capsule Commonly known as:  VISTARIL TAKE 2 CAPSULES(50 MG) BY MOUTH EVERY 6 HOURS AS NEEDED FOR ANXIETY   Insulin Pen Needle 30G X 8 MM Misc Commonly known as:  SURE COMFORT PEN NEEDLES Use for injections   liraglutide 18 MG/3ML Sopn Commonly known as:  VICTOZA ADMINISTER 1.8 MG UNDER THE SKIN EVERY DAY   montelukast 10 MG tablet Commonly known as:  SINGULAIR Take 1 tablet (10 mg total) by mouth daily.   rosuvastatin 20 MG tablet Commonly known as:  CRESTOR Take 1 tablet (20 mg total) by mouth daily. Reported on 12/24/2015   traZODone 50 MG tablet Commonly known as:  DESYREL Take 1.5 tablets (75 mg total) by mouth at bedtime as needed for sleep.   triamcinolone cream 0.1 % Commonly known as:  KENALOG Apply 1 application topically 2 (two) times daily. What changed:  when to take this  reasons to take this      Follow-up Information    Myrlene Broker, MD Follow up.   Specialty:  Internal Medicine Why:  you must follow up in 1-2 wks to discuss better control of your diabetes, weight loss and have a BP recheck.  Contact information: [address] [place] [address] [phone]           Allergies  Allergen Reactions  . Metformin And Related Diarrhea and Nausea And Vomiting    Pt requests that adverse reaction be reported for high doses of Metformin  . Other     Walnuts  . Oxycodone     Trouble breathing     Procedures/Studies:    No results found.     Discharge Exam: Vitals:   04/12/17 1150 04/12/17 1200  BP:  (!) 156/100  Pulse:  79  Resp:  14  Temp: 98.3 F (36.8 C)    Vitals:   04/12/17 0800 04/12/17 0805 04/12/17 1150 04/12/17 1200  BP:  Marland Kitchen)  165/87  (!) 156/100  Pulse: 74 74  79  Resp:    14  Temp: 98.4 F (36.9 C)  98.3 F (36.8 C)   TempSrc: Oral  Oral   SpO2: 99% 100%  100%  Weight:      Height:        General: Pt is alert, awake, not in acute distress Cardiovascular: RRR, S1/S2 +, no rubs, no gallops Respiratory: CTA bilaterally, no wheezing, no rhonchi Abdominal: Soft, NT, ND, bowel sounds + Extremities: no edema, no cyanosis    The results of significant diagnostics from this hospitalization (including imaging, microbiology, ancillary and laboratory) are listed below for reference.     Microbiology: Recent Results (from the past 240 hour(s))  MRSA PCR Screening     Status: None   Collection Time: 04/10/17  6:36 PM  Result Value Ref Range Status   MRSA by PCR NEGATIVE NEGATIVE Final    Comment:        The GeneXpert MRSA Assay (FDA approved for NASAL specimens only), is one component of a comprehensive MRSA colonization surveillance program. It is not intended to diagnose MRSA infection nor to guide or monitor treatment for MRSA infections.      Labs: BNP (last 3 results) No results for input(s): BNP in the last 8760 hours. Basic Metabolic Panel:  Recent Labs Lab 04/11/17 0353 04/11/17 0743 04/11/17 1219 04/12/17 0346 04/12/17 1151  NA 136 135 139 138 138  K 3.4* 3.2* 3.1* 3.6 3.5  CL 115* 114* 116* 114* 108  CO2 15* 14* 17* 18* 23  GLUCOSE 167* 168* 150* 139* 228*  BUN 12 11 9  <5* <5*   CREATININE 0.42* 0.32* 0.33* 0.33* 0.37*  CALCIUM 7.9* 8.1* 8.1* 8.2* 8.4*  MG  --   --   --  1.8  --   PHOS  --   --   --  1.4*  --    Liver Function Tests:  Recent Labs Lab 04/10/17 0428  AST 22  ALT 27  ALKPHOS 67  BILITOT 1.0  PROT 7.9  ALBUMIN 3.8    Recent Labs Lab 04/10/17 0428  LIPASE 21   No results for input(s): AMMONIA in the last 168 hours. CBC:  Recent Labs Lab 04/10/17 0428 04/10/17 0938  WBC 5.4  --   HGB 17.6* 13.6  HCT 48.5* 40.0  MCV 80.8  --   PLT 308  --    Cardiac Enzymes: No results for input(s): CKTOTAL, CKMB, CKMBINDEX, TROPONINI in the last 168 hours. BNP: Invalid input(s): POCBNP CBG:  Recent Labs Lab 04/12/17 0632 04/12/17 0740 04/12/17 0841 04/12/17 1103 04/12/17 1200  GLUCAP 148* 145* 138* 213* 243*   D-Dimer No results for input(s): DDIMER in the last 72 hours. Hgb A1c No results for input(s): HGBA1C in the last 72 hours. Lipid Profile No results for input(s): CHOL, HDL, LDLCALC, TRIG, CHOLHDL, LDLDIRECT in the last 72 hours. Thyroid function studies No results for input(s): TSH, T4TOTAL, T3FREE, THYROIDAB in the last 72 hours.  Invalid input(s): FREET3 Anemia work up No results for input(s): VITAMINB12, FOLATE, FERRITIN, TIBC, IRON, RETICCTPCT in the last 72 hours. Urinalysis    Component Value Date/Time   COLORURINE YELLOW 04/10/2017 0802   APPEARANCEUR HAZY (A) 04/10/2017 0802   LABSPEC 1.029 04/10/2017 0802   PHURINE 6.0 04/10/2017 0802   GLUCOSEU >=500 (A) 04/10/2017 0802   HGBUR LARGE (A) 04/10/2017 0802   BILIRUBINUR NEGATIVE 04/10/2017 0802   KETONESUR 80 (A) 04/10/2017 0802  PROTEINUR 30 (A) 04/10/2017 0802   UROBILINOGEN 0.2 04/27/2015 0601   NITRITE NEGATIVE 04/10/2017 0802   LEUKOCYTESUR NEGATIVE 04/10/2017 0802   Sepsis Labs Invalid input(s): PROCALCITONIN,  WBC,  LACTICIDVEN Microbiology Recent Results (from the past 240 hour(s))  MRSA PCR Screening     Status: None   Collection Time:  04/10/17  6:36 PM  Result Value Ref Range Status   MRSA by PCR NEGATIVE NEGATIVE Final    Comment:        The GeneXpert MRSA Assay (FDA approved for NASAL specimens only), is one component of a comprehensive MRSA colonization surveillance program. It is not intended to diagnose MRSA infection nor to guide or monitor treatment for MRSA infections.      Time coordinating discharge: Over 30 minutes  SIGNED:   Calvert CantorSaima Marleigh Kaylor, MD  [facility] 04/12/2017, 2:15 PM Pager   If 7PM-7AM, please contact night-coverage [email or web address] Password [email or web address]

## 2017-04-12 NOTE — Care Management Note (Signed)
Case Management Note  Patient Details  Name: Rita Hogan MRN: [id number] Date of Birth: 1986-07-07  Subjective/Objective:                  31 y.o.femalewith history of obesity, hypertension, hyperlipidemia, diabetes mellitus type 2, depression and anxiety, and asthma who presents with nausea, vomiting, and diarrhea. The patient states that 3 days prior to admission she developed watery diarrhea which was severe associated with vomiting and low grade fever of 100.7. She has not been able to take her medications other than Trazodone.   Action/Plan: Date:  April 12, 2017  Chart reviewed for concurrent status and case management needs.  Will continue to follow patient progress.  Discharge Planning: following for needs  Expected discharge date: [id number]  Marcelle SmilingRhonda Anisa Leanos, BSN, [place]3, [place]CCM   [phone]   Expected Discharge Date:  04/13/17               Expected Discharge Plan:  Home/Self Care  In-House Referral:     Discharge planning Services  CM Consult  Post Acute Care Choice:    Choice offered to:     DME Arranged:    DME Agency:     HH Arranged:    HH Agency:     Status of Service:  In process, will continue to follow  If discussed at Long Length of Stay Meetings, dates discussed:    Additional Comments:  Golda AcreDavis, Geremiah Fussell Lynn, RN 04/12/2017, 9:06 AM

## 2017-04-12 NOTE — Progress Notes (Signed)
Inpatient Diabetes Program Recommendations  AACE/ADA: New Consensus Statement on Inpatient Glycemic Control (2015)  Target Ranges:  Prepandial:   less than 140 mg/dL      Peak postprandial:   less than 180 mg/dL (1-2 hours)      Critically ill patients:  140 - 180 mg/dL   Lab Results  Component Value Date   GLUCAP 243 (H) 04/12/2017   HGBA1C 9.2 (H) 02/26/2017    Review of Glycemic Control  Spoke with pt briefly about her diabetes management. States she does not want to take insulin, and has started with a new PCP and will follow-up with her next week. Will order OP Diabetes Education consult for uncontrolled DM.   Discussed with MD and RN.  Thank you. Ailene Ardshonda Dallana Mavity, RD, LDN, CDE Inpatient Diabetes Coordinator [phone]

## 2017-04-12 NOTE — Discharge Instructions (Signed)
I strongly recommend that you establish care with an endocrinologist and a weight loss clinic.   Please take all your medications with you for your next visit with your Primary MD. Please request your Primary MD to go over all hospital test results at the follow up. Please ask your Primary MD to get all Hospital records sent to his/her office.  If you experience worsening of your admission symptoms, develop shortness of breath, chest pain, suicidal or homicidal thoughts or a life threatening emergency, you must seek medical attention immediately by calling 911 or calling your MD.  Bonita QuinYou must read the complete instructions/literature along with all the possible adverse reactions/side effects for all the medicines you take including new medications that have been prescribed to you. Take new medicines after you have completely understood and accpet all the possible adverse reactions/side effects.   Do not drive when taking pain medications or sedatives.    Do not take more than prescribed Pain, Sleep and Anxiety Medications  If you have smoked or chewed Tobacco in the last 2 yrs please stop. Stop any regular alcohol and or recreational drug use.  Wear Seat belts while driving.

## 2017-04-12 NOTE — Progress Notes (Signed)
Date: April 12, 2017 Discharge orders review for case management needs.  None found Marcelle Smilinghonda Davis, BSN, [place]3, [place]CCM: [phone]

## 2017-04-16 LAB — HM DIABETES EYE EXAM

## 2017-04-19 ENCOUNTER — Ambulatory Visit: Payer: BLUE CROSS/BLUE SHIELD | Admitting: Internal Medicine

## 2017-04-20 ENCOUNTER — Encounter: Payer: Self-pay | Admitting: Internal Medicine

## 2017-04-20 NOTE — Progress Notes (Unsigned)
Results entered and sent to scan  

## 2017-04-23 ENCOUNTER — Other Ambulatory Visit (INDEPENDENT_AMBULATORY_CARE_PROVIDER_SITE_OTHER): Payer: BLUE CROSS/BLUE SHIELD

## 2017-04-23 ENCOUNTER — Ambulatory Visit (INDEPENDENT_AMBULATORY_CARE_PROVIDER_SITE_OTHER): Payer: BLUE CROSS/BLUE SHIELD | Admitting: Nurse Practitioner

## 2017-04-23 ENCOUNTER — Encounter: Payer: Self-pay | Admitting: Nurse Practitioner

## 2017-04-23 VITALS — BP 130/80 | HR 97 | Temp 98.8°F | Ht 61.0 in | Wt 250.0 lb

## 2017-04-23 DIAGNOSIS — E1165 Type 2 diabetes mellitus with hyperglycemia: Secondary | ICD-10-CM

## 2017-04-23 DIAGNOSIS — R41840 Attention and concentration deficit: Secondary | ICD-10-CM

## 2017-04-23 DIAGNOSIS — G4700 Insomnia, unspecified: Secondary | ICD-10-CM | POA: Diagnosis not present

## 2017-04-23 LAB — BASIC METABOLIC PANEL WITH GFR
BUN: 19 mg/dL (ref 6–23)
CO2: 24 meq/L (ref 19–32)
Calcium: 9.7 mg/dL (ref 8.4–10.5)
Chloride: 100 meq/L (ref 96–112)
Creatinine, Ser: 0.54 mg/dL (ref 0.40–1.20)
GFR: 140.69 mL/min
Glucose, Bld: 156 mg/dL — ABNORMAL HIGH (ref 70–99)
Potassium: 4.4 meq/L (ref 3.5–5.1)
Sodium: 135 meq/L (ref 135–145)

## 2017-04-23 MED ORDER — SUVOREXANT 10 MG PO TABS: 1.0000 | ORAL_TABLET | Freq: Every evening | ORAL | 0 refills | Status: DC | PRN

## 2017-04-23 NOTE — Patient Instructions (Signed)
Check glucose twice a day (before breakfast and at bedtime) Bring glucose records to next office visit.  Eliminate caffeine use. Consider referral for sleep apnea evaluation.  Encourage daily exercise.  Diabetes Mellitus and Food It is important for you to manage your blood sugar (glucose) level. Your blood glucose level can be greatly affected by what you eat. Eating healthier foods in the appropriate amounts throughout the day at about the same time each day will help you control your blood glucose level. It can also help slow or prevent worsening of your diabetes mellitus. Healthy eating may even help you improve the level of your blood pressure and reach or maintain a healthy weight. General recommendations for healthful eating and cooking habits include:  Eating meals and snacks regularly. Avoid going long periods of time without eating to lose weight.  Eating a diet that consists mainly of plant-based foods, such as fruits, vegetables, nuts, legumes, and whole grains.  Using low-heat cooking methods, such as baking, instead of high-heat cooking methods, such as deep frying.  Work with your dietitian to make sure you understand how to use the Nutrition Facts information on food labels. How can food affect me? Carbohydrates Carbohydrates affect your blood glucose level more than any other type of food. Your dietitian will help you determine how many carbohydrates to eat at each meal and teach you how to count carbohydrates. Counting carbohydrates is important to keep your blood glucose at a healthy level, especially if you are using insulin or taking certain medicines for diabetes mellitus. Alcohol Alcohol can cause sudden decreases in blood glucose (hypoglycemia), especially if you use insulin or take certain medicines for diabetes mellitus. Hypoglycemia can be a life-threatening condition. Symptoms of hypoglycemia (sleepiness, dizziness, and disorientation) are similar to symptoms of  having too much alcohol. If your health care provider has given you approval to drink alcohol, do so in moderation and use the following guidelines:  Women should not have more than one drink per day, and men should not have more than two drinks per day. One drink is equal to: ? 12 oz of beer. ? 5 oz of wine. ? 1 oz of hard liquor.  Do not drink on an empty stomach.  Keep yourself hydrated. Have water, diet soda, or unsweetened iced tea.  Regular soda, juice, and other mixers might contain a lot of carbohydrates and should be counted.  What foods are not recommended? As you make food choices, it is important to remember that all foods are not the same. Some foods have fewer nutrients per serving than other foods, even though they might have the same number of calories or carbohydrates. It is difficult to get your body what it needs when you eat foods with fewer nutrients. Examples of foods that you should avoid that are high in calories and carbohydrates but low in nutrients include:  Trans fats (most processed foods list trans fats on the Nutrition Facts label).  Regular soda.  Juice.  Candy.  Sweets, such as cake, pie, doughnuts, and cookies.  Fried foods.  What foods can I eat? Eat nutrient-rich foods, which will nourish your body and keep you healthy. The food you should eat also will depend on several factors, including:  The calories you need.  The medicines you take.  Your weight.  Your blood glucose level.  Your blood pressure level.  Your cholesterol level.  You should eat a variety of foods, including:  Protein. ? Lean cuts of meat. ? Proteins  low in saturated fats, such as fish, egg whites, and beans. Avoid processed meats.  Fruits and vegetables. ? Fruits and vegetables that may help control blood glucose levels, such as apples, mangoes, and yams.  Dairy products. ? Choose fat-free or low-fat dairy products, such as milk, yogurt, and  cheese.  Grains, bread, pasta, and rice. ? Choose whole grain products, such as multigrain bread, whole oats, and brown rice. These foods may help control blood pressure.  Fats. ? Foods containing healthful fats, such as nuts, avocado, olive oil, canola oil, and fish.  Does everyone with diabetes mellitus have the same meal plan? Because every person with diabetes mellitus is different, there is not one meal plan that works for everyone. It is very important that you meet with a dietitian who will help you create a meal plan that is just right for you. This information is not intended to replace advice given to you by your health care provider. Make sure you discuss any questions you have with your health care provider. Document Released: 07/23/2005 Document Revised: 04/02/2016 Document Reviewed: 09/22/2013 Elsevier Interactive Patient Education  2017 [employer].

## 2017-04-23 NOTE — Progress Notes (Signed)
Subjective:  Patient ID: Rita Hogan, female    DOB: Jan 19, 1986  Age: 31 y.o. MRN: [id number]  CC: Hospitalization Follow-up (went hospital 2 wks, stomach flu,--dx with diabetic ketoacidosi--lab were off--follow up DM)   HPI  DM: Does not check glucose at home due to cost of strip. Recent hospitalization 04/10/2017 to 04/12/2017 due to gastroenteritis and DKA. Has not taken medications as prescribed due to lack on money. Does not follow any particular diet. Does not exercise.  Anxiety and Insomnia: No improvement with trazodone and vistaril. Has difficulty falling asleep and staying asleep. Drinks 3-32oz energy drinks per day. Ongoing sessions with psychologist.  Gastroenteritis: Resolved.  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 She will like an evaluation for ADD or ADHD.  Outpatient Medications Prior to Visit  Medication Sig Dispense Refill  . benazepril (LOTENSIN) 40 MG tablet Take 1 tablet (40 mg total) by mouth daily. 30 tablet 6  . buPROPion (WELLBUTRIN XL) 300 MG 24 hr tablet TAKE 1 TABLET(300 MG) BY MOUTH DAILY 90 tablet 3  . canagliflozin (INVOKANA) 300 MG TABS tablet Take 1 tablet (300 mg total) by mouth daily before breakfast. Reported on 12/24/2015 30 tablet 6  . cetirizine (ZYRTEC) 10 MG tablet Take 1 tablet (10 mg total) by mouth daily. 30 tablet 11  . citalopram (CELEXA) 40 MG tablet TAKE 1 TABLET(40 MG) BY MOUTH DAILY 90 tablet 3  . fluconazole (DIFLUCAN) 150 MG tablet Take 1 tablet (150 mg total) by mouth every 3 (three) days. 10 tablet 3  . glimepiride (AMARYL) 4 MG tablet Take 1 tablet (4 mg total) by mouth daily with breakfast. 30 tablet 6  . glucose blood (ACCU-CHEK AVIVA PLUS) test strip 1 each by Other route 2 (two) times daily. Use to check blood sugar twice a day. Dx E11.9 100 each 3  . hydrOXYzine (VISTARIL) 25 MG capsule TAKE 2 CAPSULES(50 MG) BY MOUTH EVERY 6 HOURS AS NEEDED FOR ANXIETY 60 capsule 0  . Insulin Pen Needle (SURE COMFORT PEN  NEEDLES) 30G X 8 MM MISC Use for injections 100 each 11  . liraglutide (VICTOZA) 18 MG/3ML SOPN ADMINISTER 1.8 MG UNDER THE SKIN EVERY DAY 9 mL 11  . montelukast (SINGULAIR) 10 MG tablet Take 1 tablet (10 mg total) by mouth daily. 30 tablet 6  . rosuvastatin (CRESTOR) 20 MG tablet Take 1 tablet (20 mg total) by mouth daily. Reported on 12/24/2015 30 tablet 6  . triamcinolone cream (KENALOG) 0.1 % Apply 1 application topically 2 (two) times daily. (Patient taking differently: Apply 1 application topically 2 (two) times daily as needed (eczema). ) 30 g 2  . traZODone (DESYREL) 50 MG tablet Take 1.5 tablets (75 mg total) by mouth at bedtime as needed for sleep. 45 tablet 11   No facility-administered medications prior to visit.     ROS Review of Systems  Respiratory: Negative.   Cardiovascular: Negative.   Gastrointestinal: Negative.   Skin: Negative.   Neurological: Negative for tingling and sensory change.     Objective:  BP 130/80   Pulse 97   Temp 98.8 F (37.1 C)   Ht 5\' 1"  (1.549 m)   Wt 250 lb (113.4 kg)   LMP 04/08/2017 (Exact Date)   SpO2 99%   BMI 47.24 kg/m   BP Readings from Last 3 Encounters:  04/23/17 130/80  04/12/17 (!) 156/100  03/05/17 (!) 119/53    Wt Readings from Last 3 Encounters:  04/23/17 250 lb (113.4 kg)  04/10/17 245 lb 4 oz (111.2  kg)  02/26/17 259 lb (117.5 kg)    Physical Exam  Constitutional: She is oriented to person, place, and time. No distress.  Cardiovascular: Normal rate, regular rhythm and normal heart sounds.   Pulmonary/Chest: Effort normal and breath sounds normal.  Abdominal: Soft. Bowel sounds are normal.  Musculoskeletal: She exhibits no edema or tenderness.  Neurological: She is alert and oriented to person, place, and time.  Skin: Skin is warm and dry.  Vitals reviewed.   Lab Results  Component Value Date   WBC 5.4 04/10/2017   HGB 13.6 04/10/2017   HCT 40.0 04/10/2017   PLT 308 04/10/2017   GLUCOSE 156 (H)  04/23/2017   CHOL 298 (H) 12/24/2015   TRIG 220.0 (H) 12/24/2015   HDL 52.80 12/24/2015   LDLDIRECT 200.0 12/24/2015   LDLCALC 127 (H) 09/29/2010   ALT 27 04/10/2017   AST 22 04/10/2017   NA 135 04/23/2017   K 4.4 04/23/2017   CL 100 04/23/2017   CREATININE 0.54 04/23/2017   BUN 19 04/23/2017   CO2 24 04/23/2017   TSH 1.785 09/29/2010   HGBA1C 9.2 (H) 02/26/2017   MICROALBUR 10.8 (H) 12/24/2015    No results found.  Assessment & Plan:   Rita Hogan was seen today for hospitalization follow-up.  Diagnoses and all orders for this visit:  Type 2 diabetes mellitus with hyperglycemia, without long-term current use of insulin (HCC) -     Basic metabolic panel; Future  Inattention -     Ambulatory referral to Neuropsychology  Insomnia, unspecified type -     Suvorexant (BELSOMRA) 10 MG TABS; Take 1 tablet by mouth at bedtime as needed.   I have discontinued Rita Hogan's traZODone. I am also having her start on Suvorexant. Additionally, I am having her maintain her montelukast, rosuvastatin, fluconazole, Insulin Pen Needle, cetirizine, glucose blood, triamcinolone cream, liraglutide, glimepiride, citalopram, canagliflozin, buPROPion, benazepril, hydrOXYzine, and B-D ULTRAFINE III SHORT PEN.  Meds ordered this encounter  Medications  . B-D ULTRAFINE III SHORT PEN 31G X 8 MM MISC    Sig: See admin instructions.    Refill:  11  . Suvorexant (BELSOMRA) 10 MG TABS    Sig: Take 1 tablet by mouth at bedtime as needed.    Dispense:  30 tablet    Refill:  0    Order Specific Question:   Supervising Provider    Answer:   Tresa Garter [[id number]]    Follow-up: Return in about 1 month (around 05/23/2017) for DM and insomnia.  Alysia Penna, NP

## 2017-04-26 ENCOUNTER — Other Ambulatory Visit: Payer: Self-pay | Admitting: Nurse Practitioner

## 2017-05-04 ENCOUNTER — Ambulatory Visit: Payer: BLUE CROSS/BLUE SHIELD | Admitting: Obstetrics & Gynecology

## 2017-05-14 ENCOUNTER — Other Ambulatory Visit: Payer: Self-pay | Admitting: Internal Medicine

## 2017-05-24 ENCOUNTER — Ambulatory Visit (INDEPENDENT_AMBULATORY_CARE_PROVIDER_SITE_OTHER): Payer: BLUE CROSS/BLUE SHIELD | Admitting: Nurse Practitioner

## 2017-05-24 ENCOUNTER — Encounter: Payer: Self-pay | Admitting: Nurse Practitioner

## 2017-05-24 VITALS — BP 132/100 | HR 94 | Temp 98.4°F | Ht 61.0 in | Wt 258.0 lb

## 2017-05-24 DIAGNOSIS — J3489 Other specified disorders of nose and nasal sinuses: Secondary | ICD-10-CM

## 2017-05-24 DIAGNOSIS — J029 Acute pharyngitis, unspecified: Secondary | ICD-10-CM | POA: Diagnosis not present

## 2017-05-24 DIAGNOSIS — M65332 Trigger finger, left middle finger: Secondary | ICD-10-CM

## 2017-05-24 MED ORDER — CETIRIZINE HCL 10 MG PO TABS: 10.0000 mg | ORAL_TABLET | Freq: Every day | ORAL | 11 refills | Status: DC

## 2017-05-24 MED ORDER — SALINE SPRAY 0.65 % NA SOLN: 1.0000 | NASAL | 0 refills | Status: AC | PRN

## 2017-05-24 MED ORDER — FLUTICASONE PROPIONATE 50 MCG/ACT NA SUSP: 2.0000 | Freq: Every day | NASAL | 0 refills | Status: AC

## 2017-05-24 NOTE — Patient Instructions (Signed)
Wear finger splint till evaluated by sports medicine.  Call Dr. Janeece Riggersigby's office to make appt: [phone] asap.  URI Instructions: Flonase and Afrin use: apply 1spray of afrin in each nare, wait 5mins, then apply 2sprays of flonase in each nare. Use both nasal spray consecutively x 3days, then flonase only for at least 14days.  Encourage adequate oral hydration.  Use over-the-counter  "cold" medicines  such as "Tylenol cold" , "Advil cold",  "Mucinex" or" Mucinex D"  for cough and congestion.  Avoid decongestants if you have high blood pressure. Use" Delsym" or" Robitussin" cough syrup varietis for cough.  You can use plain "Tylenol" or "Advi"l for fever, chills and achyness.   "Common cold" symptoms are usually triggered by a virus.  The antibiotics are usually not necessary. On average, a" viral cold" illness would take 4-7 days to resolve. Please, make an appointment if you are not better or if you're worse.

## 2017-05-24 NOTE — Progress Notes (Signed)
Subjective:  Patient ID: Rita Hogan, female    DOB: 1986-03-08  Age: 31 y.o. MRN: [id number]  CC: Follow-up (1 mo fu--sore throat--middle finger on left hand painful--req work note off? pt is doing water chellange--drinking 16 glasses of watr a day for 1 wk. )  Sore Throat   This is a new problem. The current episode started in the past 7 days. The problem has been unchanged. There has been no fever. Associated symptoms include congestion and coughing. Pertinent negatives include no diarrhea, drooling, ear discharge, ear pain, headaches, hoarse voice, plugged ear sensation, neck pain, shortness of breath, stridor, swollen glands, trouble swallowing or vomiting. She has had no exposure to strep or mono. She has tried nothing for the symptoms.  Hand Pain   The incident occurred more than 1 week ago. There was no injury mechanism. The pain is present in the left hand. The quality of the pain is described as cramping and aching. Radiates to: left wrist. The pain has been constant since the incident. Pertinent negatives include no chest pain, muscle weakness, numbness or tingling. The symptoms are aggravated by lifting, palpation and movement. She has tried nothing for the symptoms.  hx of right trigger finger and carpal tunnel.  Outpatient Medications Prior to Visit  Medication Sig Dispense Refill  . B-D ULTRAFINE III SHORT PEN 31G X 8 MM MISC See admin instructions.  11  . benazepril (LOTENSIN) 40 MG tablet Take 1 tablet (40 mg total) by mouth daily. 30 tablet 6  . buPROPion (WELLBUTRIN XL) 300 MG 24 hr tablet TAKE 1 TABLET(300 MG) BY MOUTH DAILY 90 tablet 3  . canagliflozin (INVOKANA) 300 MG TABS tablet Take 1 tablet (300 mg total) by mouth daily before breakfast. Reported on 12/24/2015 30 tablet 6  . citalopram (CELEXA) 40 MG tablet TAKE 1 TABLET(40 MG) BY MOUTH DAILY 90 tablet 3  . fluconazole (DIFLUCAN) 150 MG tablet TAKE 1 TABLET(150 MG) BY MOUTH EVERY 3 DAYS 10 tablet 0  . glimepiride  (AMARYL) 4 MG tablet Take 1 tablet (4 mg total) by mouth daily with breakfast. 30 tablet 6  . glucose blood (ACCU-CHEK AVIVA PLUS) test strip 1 each by Other route 2 (two) times daily. Use to check blood sugar twice a day. Dx E11.9 100 each 3  . hydrOXYzine (VISTARIL) 25 MG capsule TAKE 2 CAPSULES(50 MG) BY MOUTH EVERY 6 HOURS AS NEEDED FOR ANXIETY 60 capsule 0  . Insulin Pen Needle (SURE COMFORT PEN NEEDLES) 30G X 8 MM MISC Use for injections 100 each 11  . liraglutide (VICTOZA) 18 MG/3ML SOPN ADMINISTER 1.8 MG UNDER THE SKIN EVERY DAY 9 mL 11  . montelukast (SINGULAIR) 10 MG tablet Take 1 tablet (10 mg total) by mouth daily. 30 tablet 6  . rosuvastatin (CRESTOR) 20 MG tablet Take 1 tablet (20 mg total) by mouth daily. Reported on 12/24/2015 30 tablet 6  . Suvorexant (BELSOMRA) 10 MG TABS Take 1 tablet by mouth at bedtime as needed. 30 tablet 0  . cetirizine (ZYRTEC) 10 MG tablet Take 1 tablet (10 mg total) by mouth daily. 30 tablet 11  . triamcinolone cream (KENALOG) 0.1 % Apply 1 application topically 2 (two) times daily. (Patient taking differently: Apply 1 application topically 2 (two) times daily as needed (eczema). ) 30 g 2  . montelukast (SINGULAIR) 10 MG tablet Take 1 tablet (10 mg total) by mouth at bedtime. Annual appt due in Sept must see MD for refills (Patient not taking: Reported on 05/24/2017)  90 tablet 0  . rosuvastatin (CRESTOR) 20 MG tablet Take 1 tablet (20 mg total) by mouth daily. Annual appt due in Sept must see MD for refills (Patient not taking: Reported on 05/24/2017) 90 tablet 0   No facility-administered medications prior to visit.     ROS See HPI  Objective:  BP (!) 132/100   Pulse 94   Temp 98.4 F (36.9 C)   Ht 5\' 1"  (1.549 m)   Wt 258 lb (117 kg)   SpO2 98%   BMI 48.75 kg/m   BP Readings from Last 3 Encounters:  05/24/17 (!) 132/100  04/23/17 130/80  04/12/17 (!) 156/100    Wt Readings from Last 3 Encounters:  05/24/17 258 lb (117 kg)  04/23/17 250  lb (113.4 kg)  04/10/17 245 lb 4 oz (111.2 kg)    Physical Exam  Constitutional: She is oriented to person, place, and time. No distress.  HENT:  Right Ear: Tympanic membrane, external ear and ear canal normal.  Left Ear: Tympanic membrane, external ear and ear canal normal.  Nose: Mucosal edema and rhinorrhea present. Right sinus exhibits no maxillary sinus tenderness and no frontal sinus tenderness. Left sinus exhibits no maxillary sinus tenderness and no frontal sinus tenderness.  Mouth/Throat: Uvula is midline. No oral lesions. No trismus in the jaw. Posterior oropharyngeal erythema present. No oropharyngeal exudate.  Neck: Normal range of motion. Neck supple. No thyromegaly present.  Cardiovascular: Normal rate.   Pulmonary/Chest: Effort normal.  Musculoskeletal: She exhibits tenderness. She exhibits no edema.       Left hand: She exhibits decreased range of motion and tenderness. She exhibits no swelling. Normal sensation noted. Normal strength noted.       Hands: Lymphadenopathy:    She has no cervical adenopathy.  Neurological: She is alert and oriented to person, place, and time.  Skin: Skin is warm.  Vitals reviewed.   Lab Results  Component Value Date   WBC 5.4 04/10/2017   HGB 13.6 04/10/2017   HCT 40.0 04/10/2017   PLT 308 04/10/2017   GLUCOSE 156 (H) 04/23/2017   CHOL 298 (H) 12/24/2015   TRIG 220.0 (H) 12/24/2015   HDL 52.80 12/24/2015   LDLDIRECT 200.0 12/24/2015   LDLCALC 127 (H) 09/29/2010   ALT 27 04/10/2017   AST 22 04/10/2017   NA 135 04/23/2017   K 4.4 04/23/2017   CL 100 04/23/2017   CREATININE 0.54 04/23/2017   BUN 19 04/23/2017   CO2 24 04/23/2017   TSH 1.785 09/29/2010   HGBA1C 9.2 (H) 02/26/2017   MICROALBUR 10.8 (H) 12/24/2015    No results found.  Assessment & Plan:   Rita Hogan was seen today for follow-up.  Diagnoses and all orders for this visit:  Sore throat -     fluticasone (FLONASE) 50 MCG/ACT nasal spray; Place 2 sprays  into both nostrils daily. -     sodium chloride (OCEAN) 0.65 % SOLN nasal spray; Place 1 spray into both nostrils as needed for congestion. -     cetirizine (ZYRTEC) 10 MG tablet; Take 1 tablet (10 mg total) by mouth daily.  Trigger finger, left middle finger -     Ambulatory referral to Sports Medicine  Nasal congestion with rhinorrhea -     fluticasone (FLONASE) 50 MCG/ACT nasal spray; Place 2 sprays into both nostrils daily.   I have discontinued Ms. Schlechter's triamcinolone cream. I am also having her start on fluticasone and sodium chloride. Additionally, I am having her maintain her  montelukast, rosuvastatin, Insulin Pen Needle, glucose blood, liraglutide, glimepiride, citalopram, canagliflozin, buPROPion, benazepril, B-D ULTRAFINE III SHORT PEN, Suvorexant, hydrOXYzine, fluconazole, traZODone, and cetirizine.  Meds ordered this encounter  Medications  . traZODone (DESYREL) 50 MG tablet    Refill:  11  . fluticasone (FLONASE) 50 MCG/ACT nasal spray    Sig: Place 2 sprays into both nostrils daily.    Dispense:  16 g    Refill:  0    Order Specific Question:   Supervising Provider    Answer:   Tresa GarterPLOTNIKOV, ALEKSEI V [[id number]]  . sodium chloride (OCEAN) 0.65 % SOLN nasal spray    Sig: Place 1 spray into both nostrils as needed for congestion.    Dispense:  15 mL    Refill:  0    Order Specific Question:   Supervising Provider    Answer:   Tresa GarterPLOTNIKOV, ALEKSEI V [[id number]]  . cetirizine (ZYRTEC) 10 MG tablet    Sig: Take 1 tablet (10 mg total) by mouth daily.    Dispense:  30 tablet    Refill:  11    Order Specific Question:   Supervising Provider    Answer:   Tresa GarterPLOTNIKOV, ALEKSEI V [[id number]]    Follow-up: Return if symptoms worsen or fail to improve.  Alysia Pennaharlotte Elbert Spickler, NP

## 2017-05-25 ENCOUNTER — Encounter: Payer: Self-pay | Admitting: Sports Medicine

## 2017-05-25 ENCOUNTER — Ambulatory Visit (INDEPENDENT_AMBULATORY_CARE_PROVIDER_SITE_OTHER): Payer: BLUE CROSS/BLUE SHIELD | Admitting: Sports Medicine

## 2017-05-25 ENCOUNTER — Ambulatory Visit: Payer: Self-pay

## 2017-05-25 VITALS — BP 140/100 | HR 100 | Ht 61.0 in | Wt 261.0 lb

## 2017-05-25 DIAGNOSIS — G5603 Carpal tunnel syndrome, bilateral upper limbs: Secondary | ICD-10-CM | POA: Diagnosis not present

## 2017-05-25 DIAGNOSIS — M65342 Trigger finger, left ring finger: Secondary | ICD-10-CM | POA: Insufficient documentation

## 2017-05-25 DIAGNOSIS — I7300 Raynaud's syndrome without gangrene: Secondary | ICD-10-CM | POA: Diagnosis not present

## 2017-05-25 DIAGNOSIS — E0810 Diabetes mellitus due to underlying condition with ketoacidosis without coma: Secondary | ICD-10-CM

## 2017-05-25 MED ORDER — DICLOFENAC SODIUM 2 % TD SOLN: 1.0000 "application " | Freq: Two times a day (BID) | TRANSDERMAL | 2 refills | Status: DC

## 2017-05-25 MED ORDER — DICLOFENAC SODIUM 2 % TD SOLN: 1.0000 "application " | Freq: Two times a day (BID) | TRANSDERMAL | 0 refills | Status: AC

## 2017-05-25 NOTE — Assessment & Plan Note (Signed)
Given diabetes will begin with one injection today and can consider right hand trigger finger injection and/or carpal tunnel injections in the future if any lack of improvement.   ++++++++++++++++++++++++++++++++++++++++++++ PROCEDURE NOTE -  ULTRASOUND GUIDEDINJECTION: Left fourth finger trigger finger injection Images were obtained and interpreted by myself, Gaspar BiddingMichael Rigby, DO  Images have been saved and stored to PACS system. Images obtained on: GE S7 Ultrasound machine  ULTRASOUND FINDINGS:  Triggering of the fourth flexor tendon present Left median nerve measures 0.  11 cm Right median nerve measures 0.13 cm  triggering is present on the right fourth flexor tendon  DESCRIPTION OF PROCEDURE:  The patient's clinical condition is marked by substantial pain and/or significant functional disability. Other conservative therapy has not provided relief, is contraindicated, or not appropriate. There is a reasonable likelihood that injection will significantly improve the patient's pain and/or functional impairment. After discussing the risks, benefits and expected outcomes of the injection and all questions were reviewed and answered, the patient wished to undergo the above named procedure. Verbal consent was obtained. The ultrasound was used to identify the target structure and adjacent neurovascular structures. The skin was then prepped in sterile fashion and the target structure was injected under direct visualization using sterile technique as below: PREP: Alcohol, Ethel Chloride APPROACH: direct inplane, single injection, 25g 1.5" needle INJECTATE: 1cc 1% lidocaine, 0.5cc 40mg  DepoMedrol ASPIRATE: N/A DRESSING: Band-Aid   Post procedural instructions including recommending icing and warning signs for infection were reviewed. This procedure was well tolerated and there were no complications.   IMPRESSION: Succesful US Guided Injection

## 2017-05-25 NOTE — Assessment & Plan Note (Signed)
Recent admission for ketoacidosis.  Needs to continue medications and monitoring of blood sugars.

## 2017-05-25 NOTE — Patient Instructions (Addendum)

## 2017-05-25 NOTE — Assessment & Plan Note (Addendum)
Occurred following injection of trigger finger.  Confirmed no epinephrine used and patient did confirm that it does occasionally happen especially exposing her hands to cold such as while driving with the air conditioning vent.  Consider autoimmune workup if persistent ongoing symptoms

## 2017-05-25 NOTE — Progress Notes (Signed)
OFFICE VISIT NOTE Veverly FellsMichael D. Delorise Shinerigby, DO  [facility] [phone]  Rita Bellinglisabeth Thiem - 31 y.o. female MRN [id number]  Date of birth: 07-14-1986  Visit Date: 05/25/2017  PCP: Myrlene Brokerrawford, Elizabeth A, MD   Referred by: Anne NgNche, Rita Lum, NP  Clovis CaoAutumn McNeil, cma acting as scribe for Dr. Berline Choughigby.  SUBJECTIVE:   Chief Complaint  Patient presents with  . Left hand Pain   HPI: As below and per problem based documentation when appropriate.    Rita Hogan presents with a 1-2 week HX of left hand pain. Location is her middle finger. No injury.  The quality of the pain is described as cramping and aching. Radiates to: left wrist. No numbness or tingling.  The symptoms are aggravated by lifting, palpation and movement. She has tried nothing for the symptoms. Hx of right trigger finger and carpal tunnel. There are similar sx in her Right middle finger but very minimal compared to the left.     Review of Systems  Constitutional: Negative for chills, diaphoresis, fever, malaise/fatigue and weight loss.  HENT: Negative.   Eyes: Negative.   Respiratory: Negative.   Cardiovascular: Negative.   Gastrointestinal: Negative.   Genitourinary: Negative.   Musculoskeletal: Positive for joint pain and neck pain. Negative for back pain, falls and myalgias.  Skin: Negative for itching and rash.  Neurological: Negative.   Endo/Heme/Allergies: Negative for environmental allergies and polydipsia. Does not bruise/bleed easily.  Psychiatric/Behavioral: Negative.     Otherwise per HPI.  HISTORY & PERTINENT PRIOR DATA:  No specialty comments available. She reports that she has been smoking.  She has quit using smokeless tobacco.   Recent Labs  10/21/16 0843 02/26/17 1431  HGBA1C 8.8* 9.2*   Medications & Allergies reviewed per EMR Patient Active Problem List   Diagnosis Date Noted  . Trigger ring finger of left hand 05/25/2017  . Raynaud phenomenon  05/25/2017  . Carpal tunnel syndrome on both sides 05/25/2017  . Gastroenteritis 04/11/2017  . Hypokalemia 04/11/2017  . DKA (diabetic ketoacidoses) (HCC) 04/10/2017  . Cat bite 02/20/2017  . Bite wound of right hand 02/20/2017  . Major depression, recurrent (HCC) 07/21/2016  . Essential hypertension 12/24/2015  . Morbid obesity (HCC) 12/24/2015  . Hidradenitis suppurativa 12/24/2015  . Type 2 diabetes mellitus (HCC) 07/09/2011   Past Medical History:  Diagnosis Date  . Allergy   . Anxiety   . Asthma   . Depression   . Diabetes mellitus   . Heart murmur   . Hyperlipidemia   . Hypertension   . Morbid obesity (HCC)    Family History  Problem Relation Age of Onset  . Adopted: Yes   Past Surgical History:  Procedure Laterality Date  . EYE SURGERY    . PREPATELLAR BURSA EXCISION     right   Social History   Occupational History  . Not on file.   Social History Main Topics  . Smoking status: Current Some Day Smoker    Last attempt to quit: 07/13/2012  . Smokeless tobacco: Former NeurosurgeonUser  . Alcohol use No     Comment: rare  . Drug use: No  . Sexual activity: Not Currently    Birth control/ protection: None    OBJECTIVE:  VS:  HT:5\' 1"  (154.9 cm)   WT:261 lb (118.4 kg)  BMI:49.4    BP:(!) 140/100  HR:100bpm  TEMP: ( )  RESP:98 % EXAM: Findings:  WDWN, NAD, Non-toxic appearing Alert & appropriately interactive  Not depressed or anxious appearing No increased work of breathing. Pupils are equal. EOM intact without nystagmus No clubbing or cyanosis of the extremities appreciated No significant rashes/lesions/ulcerations overlying the examined area. Radial pulses 2+/4.  Capillary refill less than 3 seconds even after acute Raynaud phenomenon occurred following injection.  Mild bilateral upper extremity edema. Median nerve distribution dysesthesia bilaterally. Slight triggering of the fourth finger bilaterally left worse than right with pain across the A1 pulley.   Ligamentously stable.  Grip strength intact.  Minimal pain with Tinel's bilaterally and no significant thenar atrophy.     No results found. ASSESSMENT & PLAN:   Problem List Items Addressed This Visit    DKA (diabetic ketoacidoses) (HCC)    Recent admission for ketoacidosis.  Needs to continue medications and monitoring of blood sugars.      Trigger ring finger of left hand - Primary    Given diabetes will begin with one injection today and can consider right hand trigger finger injection and/or carpal tunnel injections in the future if any lack of improvement.   ++++++++++++++++++++++++++++++++++++++++++++ PROCEDURE NOTE -  ULTRASOUND GUIDEDINJECTION: Left fourth finger trigger finger injection Images were obtained and interpreted by myself, Gaspar Bidding, DO  Images have been saved and stored to PACS system. Images obtained on: GE S7 Ultrasound machine  ULTRASOUND FINDINGS:  Triggering of the fourth flexor tendon present Left median nerve measures 0.  11 cm Right median nerve measures 0.13 cm  triggering is present on the right fourth flexor tendon  DESCRIPTION OF PROCEDURE:  The patient's clinical condition is marked by substantial pain and/or significant functional disability. Other conservative therapy has not provided relief, is contraindicated, or not appropriate. There is a reasonable likelihood that injection will significantly improve the patient's pain and/or functional impairment. After discussing the risks, benefits and expected outcomes of the injection and all questions were reviewed and answered, the patient wished to undergo the above named procedure. Verbal consent was obtained. The ultrasound was used to identify the target structure and adjacent neurovascular structures. The skin was then prepped in sterile fashion and the target structure was injected under direct visualization using sterile technique as below: PREP: Alcohol, Ethel Chloride APPROACH: direct  inplane, single injection, 25g 1.5" needle INJECTATE: 1cc 1% lidocaine, 0.5cc 40mg  DepoMedrol ASPIRATE: N/A DRESSING: Band-Aid   Post procedural instructions including recommending icing and warning signs for infection were reviewed. This procedure was well tolerated and there were no complications.   IMPRESSION: Succesful US Guided Injection         Relevant Orders   US GUIDED NEEDLE PLACEMENT(NO LINKED CHARGES)   Raynaud phenomenon    Occurred following injection of trigger finger.  Confirmed no epinephrine used and patient did confirm that it does occasionally happen especially exposing her hands to cold such as while driving with the air conditioning vent.  Consider autoimmune workup if persistent ongoing symptoms      Carpal tunnel syndrome on both sides    May be a candidate for injections in the future but will defer this at this time given diabetic history         Follow-up: Return in about 2 weeks (around 06/08/2017).    CMA/ATC served as Neurosurgeon during this visit. History, Physical, and Plan performed by medical provider. Documentation and orders reviewed and attested to.      Gaspar Bidding, DO    [facility] Phys[facility]

## 2017-05-25 NOTE — Assessment & Plan Note (Signed)
May be a candidate for injections in the future but will defer this at this time given diabetic history

## 2017-05-31 ENCOUNTER — Ambulatory Visit: Payer: BLUE CROSS/BLUE SHIELD | Admitting: Obstetrics & Gynecology

## 2017-06-18 ENCOUNTER — Ambulatory Visit: Payer: BLUE CROSS/BLUE SHIELD | Admitting: Sports Medicine

## 2017-06-21 ENCOUNTER — Ambulatory Visit: Payer: BLUE CROSS/BLUE SHIELD | Admitting: Sports Medicine

## 2019-09-14 ENCOUNTER — Other Ambulatory Visit: Payer: Self-pay

## 2019-09-14 DIAGNOSIS — Z20822 Contact with and (suspected) exposure to covid-19: Secondary | ICD-10-CM

## 2019-09-16 LAB — NOVEL CORONAVIRUS, NAA: SARS-CoV-2, NAA: NOT DETECTED

## 2019-10-02 ENCOUNTER — Other Ambulatory Visit: Payer: Self-pay

## 2019-10-02 DIAGNOSIS — Z20822 Contact with and (suspected) exposure to covid-19: Secondary | ICD-10-CM

## 2019-10-03 LAB — NOVEL CORONAVIRUS, NAA: SARS-CoV-2, NAA: NOT DETECTED

## 2019-10-30 ENCOUNTER — Ambulatory Visit: Payer: HRSA Program | Attending: Internal Medicine

## 2019-10-30 DIAGNOSIS — Z20822 Contact with and (suspected) exposure to covid-19: Secondary | ICD-10-CM

## 2019-10-30 DIAGNOSIS — Z20828 Contact with and (suspected) exposure to other viral communicable diseases: Secondary | ICD-10-CM | POA: Insufficient documentation

## 2019-10-31 LAB — NOVEL CORONAVIRUS, NAA: SARS-CoV-2, NAA: NOT DETECTED

## 2022-01-12 ENCOUNTER — Emergency Department (HOSPITAL_COMMUNITY)
Admission: EM | Admit: 2022-01-12 | Discharge: 2022-01-12 | Disposition: A | Discharge status: Home / Self Care | Payer: BC Managed Care – PPO | Attending: Emergency Medicine | Admitting: Emergency Medicine

## 2022-01-12 DIAGNOSIS — E119 Type 2 diabetes mellitus without complications: Secondary | ICD-10-CM | POA: Insufficient documentation

## 2022-01-12 DIAGNOSIS — R197 Diarrhea, unspecified: Secondary | ICD-10-CM | POA: Insufficient documentation

## 2022-01-12 DIAGNOSIS — Z794 Long term (current) use of insulin: Secondary | ICD-10-CM | POA: Diagnosis not present

## 2022-01-12 DIAGNOSIS — Z7984 Long term (current) use of oral hypoglycemic drugs: Secondary | ICD-10-CM | POA: Insufficient documentation

## 2022-01-12 DIAGNOSIS — R112 Nausea with vomiting, unspecified: Secondary | ICD-10-CM | POA: Diagnosis not present

## 2022-01-12 LAB — BASIC METABOLIC PANEL
Anion gap: 12 (ref 5–15)
BUN: 21 mg/dL — ABNORMAL HIGH (ref 6–20)
CO2: 20 mmol/L — ABNORMAL LOW (ref 22–32)
Calcium: 8 mg/dL — ABNORMAL LOW (ref 8.9–10.3)
Chloride: 107 mmol/L (ref 98–111)
Creatinine, Ser: 0.78 mg/dL (ref 0.44–1.00)
GFR, Estimated: >60 mL/min (ref >60)
Glucose, Bld: 196 mg/dL — ABNORMAL HIGH (ref 70–99)
Potassium: 4.1 mmol/L (ref 3.5–5.1)
Sodium: 139 mmol/L (ref 135–145)

## 2022-01-12 LAB — CBC WITH DIFFERENTIAL/PLATELET
Abs Immature Granulocytes: 0.06 10*3/uL (ref 0.00–0.07)
Basophils Absolute: 0 10*3/uL (ref 0.0–0.1)
Basophils Relative: 0 %
Eosinophils Absolute: 0.1 10*3/uL (ref 0.0–0.5)
Eosinophils Relative: 1 %
HCT: 50.9 % — ABNORMAL HIGH (ref 36.0–46.0)
Hemoglobin: 17.6 g/dL — ABNORMAL HIGH (ref 12.0–15.0)
Immature Granulocytes: 0 %
Lymphocytes Relative: 3 %
Lymphs Abs: 0.4 10*3/uL — ABNORMAL LOW (ref 0.7–4.0)
MCH: 29.3 pg (ref 26.0–34.0)
MCHC: 34.6 g/dL (ref 30.0–36.0)
MCV: 84.7 fL (ref 80.0–100.0)
Monocytes Absolute: 0.4 10*3/uL (ref 0.1–1.0)
Monocytes Relative: 3 %
Neutro Abs: 13.7 10*3/uL — ABNORMAL HIGH (ref 1.7–7.7)
Neutrophils Relative %: 93 %
Platelets: 344 10*3/uL (ref 150–400)
RBC: 6.01 MIL/uL — ABNORMAL HIGH (ref 3.87–5.11)
RDW: 11.7 % (ref 11.5–15.5)
WBC: 14.6 10*3/uL — ABNORMAL HIGH (ref 4.0–10.5)
nRBC: 0 % (ref 0.0–0.2)

## 2022-01-12 LAB — URINALYSIS, ROUTINE W REFLEX MICROSCOPIC
Bilirubin Urine: NEGATIVE
Glucose, UA: >=500 mg/dL — AB
Hgb urine dipstick: NEGATIVE
Ketones, ur: 80 mg/dL — AB
Leukocytes,Ua: NEGATIVE
Nitrite: NEGATIVE
Protein, ur: NEGATIVE mg/dL
Specific Gravity, Urine: 1.028 (ref 1.005–1.030)
pH: 5 (ref 5.0–8.0)

## 2022-01-12 LAB — COMPREHENSIVE METABOLIC PANEL
ALT: 62 U/L — ABNORMAL HIGH (ref 0–44)
AST: 29 U/L (ref 15–41)
Albumin: 5 g/dL (ref 3.5–5.0)
Alkaline Phosphatase: 81 U/L (ref 38–126)
Anion gap: 18 — ABNORMAL HIGH (ref 5–15)
BUN: 25 mg/dL — ABNORMAL HIGH (ref 6–20)
CO2: 17 mmol/L — ABNORMAL LOW (ref 22–32)
Calcium: 9.4 mg/dL (ref 8.9–10.3)
Chloride: 99 mmol/L (ref 98–111)
Creatinine, Ser: 0.81 mg/dL (ref 0.44–1.00)
GFR, Estimated: >60 mL/min (ref >60)
Glucose, Bld: 292 mg/dL — ABNORMAL HIGH (ref 70–99)
Potassium: 4.2 mmol/L (ref 3.5–5.1)
Sodium: 134 mmol/L — ABNORMAL LOW (ref 135–145)
Total Bilirubin: 1.1 mg/dL (ref 0.3–1.2)
Total Protein: 8.4 g/dL — ABNORMAL HIGH (ref 6.5–8.1)

## 2022-01-12 LAB — LIPASE, BLOOD: Lipase: 38 U/L (ref 11–51)

## 2022-01-12 LAB — I-STAT BETA HCG BLOOD, ED (MC, WL, AP ONLY): I-stat hCG, quantitative: <5 m[IU]/mL (ref <5)

## 2022-01-12 LAB — CBG MONITORING, ED
Glucose-Capillary: 294 mg/dL — ABNORMAL HIGH (ref 70–99)
Glucose-Capillary: 181 mg/dL — ABNORMAL HIGH (ref 70–99)

## 2022-01-12 MED ORDER — SODIUM CHLORIDE 0.9 % IV BOLUS
1000.0000 mL | Freq: Once | INTRAVENOUS | Status: AC
  Administered 2022-01-12: 1000 mL via INTRAVENOUS

## 2022-01-12 MED ORDER — ONDANSETRON HCL 4 MG PO TABS: 4.0000 mg | ORAL_TABLET | Freq: Four times a day (QID) | ORAL | 0 refills | Status: AC

## 2022-01-12 MED ORDER — ONDANSETRON HCL 4 MG/2ML IJ SOLN
4.0000 mg | Freq: Once | INTRAMUSCULAR | Status: AC
  Administered 2022-01-12: 4 mg via INTRAVENOUS
  Filled 2022-01-12: qty 2

## 2022-01-12 NOTE — ED Provider Notes (Signed)
[facility]-EMERGENCY DEPT Provider Note   CSN: [id number] Arrival date & time: 01/12/22  0701     History  Chief Complaint  Patient presents with   Abdominal Pain    Rita Hogan is a 36 y.o. female.  36 year old female with prior medical history as detailed below presents for evaluation.  Patient reports onset of nausea, vomiting, diarrhea around midnight.  She reports multiple episodes of vomiting over the last 7 hours.  She denies fever.  She denies bloody emesis or bloody stool.  She complains of vague diffuse abdominal crampy pain.  She denies known sick contacts.  She does report that she "ate some old meat" last night.    The history is provided by the patient and medical records.  Illness Location:  Nausea, vomiting, diarrhea Severity:  Moderate Onset quality:  Gradual Duration:  7 hours Timing:  Rare Progression:  Waxing and waning Chronicity:  New     Home Medications Prior to Admission medications   Medication Sig Start Date End Date Taking? Authorizing Provider  B-D ULTRAFINE III SHORT PEN 31G X 8 MM MISC See admin instructions. 03/11/17   [provider]  benazepril (LOTENSIN) 40 MG tablet Take 1 tablet (40 mg total) by mouth daily. 02/26/17   Myrlene Broker, MD  buPROPion (WELLBUTRIN XL) 300 MG 24 hr tablet TAKE 1 TABLET(300 MG) BY MOUTH DAILY 02/26/17   Myrlene Broker, MD  canagliflozin [facility]) 300 MG TABS tablet Take 1 tablet (300 mg total) by mouth daily before breakfast. Reported on 12/24/2015 02/26/17   Myrlene Broker, MD  cetirizine (ZYRTEC) 10 MG tablet Take 1 tablet (10 mg total) by mouth daily. 05/24/17   Nche, Bonna Gains, NP  citalopram (CELEXA) 40 MG tablet TAKE 1 TABLET(40 MG) BY MOUTH DAILY 02/26/17   Myrlene Broker, MD  Diclofenac Sodium (PENNSAID) 2 % SOLN Place 1 application onto the skin 2 (two) times daily. 05/25/17   Andrena Mews, DO  fluconazole (DIFLUCAN) 150 MG tablet TAKE  1 TABLET(150 MG) BY MOUTH EVERY 3 DAYS 05/14/17   Myrlene Broker, MD  fluticasone [facility]) 50 MCG/ACT nasal spray Place 2 sprays into both nostrils daily. 05/24/17   Nche, Bonna Gains, NP  glimepiride (AMARYL) 4 MG tablet Take 1 tablet (4 mg total) by mouth daily with breakfast. 02/26/17   Myrlene Broker, MD  glucose blood (ACCU-CHEK AVIVA PLUS) test strip 1 each by Other route 2 (two) times daily. Use to check blood sugar twice a day. Dx E11.9 08/27/16   Myrlene Broker, MD  hydrOXYzine (VISTARIL) 25 MG capsule TAKE 2 CAPSULES(50 MG) BY MOUTH EVERY 6 HOURS AS NEEDED FOR ANXIETY 05/14/17   Myrlene Broker, MD  Insulin Pen Needle (SURE COMFORT PEN NEEDLES) 30G X 8 MM MISC Use for injections 08/04/16   Myrlene Broker, MD  liraglutide (VICTOZA) 18 MG/3ML SOPN ADMINISTER 1.8 MG UNDER THE SKIN EVERY DAY 02/26/17   Myrlene Broker, MD  montelukast (SINGULAIR) 10 MG tablet Take 1 tablet (10 mg total) by mouth daily. 04/15/16   Myrlene Broker, MD  rosuvastatin (CRESTOR) 20 MG tablet Take 1 tablet (20 mg total) by mouth daily. Reported on 12/24/2015 04/15/16   Myrlene Broker, MD  sodium chloride (OCEAN) 0.65 % SOLN nasal spray Place 1 spray into both nostrils as needed for congestion. 05/24/17   Nche, Bonna Gains, NP  Suvorexant (BELSOMRA) 10 MG TABS Take 1 tablet by mouth at bedtime as needed.  04/23/17   NcheBonna Gains, NP  traZODone (DESYREL) 50 MG tablet  05/14/17   [provider]      Allergies    Metformin and related, Other, and Oxycodone    Review of Systems   Review of Systems  All other systems reviewed and are negative.  Physical Exam Updated Vital Signs BP (!) 143/80 (BP Location: Left Arm)    Pulse (!) 115    Temp 97.9 F (36.6 C) (Oral)    Resp 18    Ht 5\' 1"  (1.549 m)    Wt 103.4 kg    LMP 12/07/2021    SpO2 100%    BMI 43.08 kg/m  Physical Exam Vitals and nursing note reviewed.  Constitutional:      General: She is not in  acute distress.    Appearance: Normal appearance. She is well-developed.  HENT:     Head: Normocephalic and atraumatic.  Eyes:     Conjunctiva/sclera: Conjunctivae normal.     Pupils: Pupils are equal, round, and reactive to light.  Cardiovascular:     Rate and Rhythm: Normal rate and regular rhythm.     Heart sounds: Normal heart sounds.  Pulmonary:     Effort: Pulmonary effort is normal. No respiratory distress.     Breath sounds: Normal breath sounds.  Abdominal:     General: There is no distension.     Palpations: Abdomen is soft.     Tenderness: There is abdominal tenderness.     Comments: Mild diffuse abdominal discomfort with deep palpation  Musculoskeletal:        General: No deformity. Normal range of motion.     Cervical back: Normal range of motion and neck supple.  Skin:    General: Skin is warm and dry.  Neurological:     General: No focal deficit present.     Mental Status: She is alert and oriented to person, place, and time.    ED Results / Procedures / Treatments   Labs (all labs ordered are listed, but only abnormal results are displayed) Labs Reviewed  CBG MONITORING, ED - Abnormal; Notable for the following components:      Result Value   Glucose-Capillary 294 (*)    All other components within normal limits  COMPREHENSIVE METABOLIC PANEL  CBC WITH DIFFERENTIAL/PLATELET  LIPASE, BLOOD  I-STAT BETA HCG BLOOD, ED ([facility], [facility], [facility] ONLY)    EKG None  Radiology No results found.  Procedures Procedures    Medications Ordered in ED Medications  sodium chloride 0.9 % bolus 1,000 mL (has no administration in time range)  ondansetron (ZOFRAN) injection 4 mg (has no administration in time range)    ED Course/ Medical Decision Making/ A&P                           Medical Decision Making Amount and/or Complexity of Data Reviewed Labs: ordered.  Risk Prescription drug management.    Medical Screen Complete  This patient presented to the ED  with complaint of nausea, vomiting, diarrhea.  This complaint involves an extensive number of treatment options. The initial differential diagnosis includes, but is not limited to, gastroenteritis, metabolic abnormality, DKA, dehydration, AKI, etc.  This presentation is: Acute, Chronic, Self-Limited, Previously Undiagnosed, Uncertain Prognosis, Complicated, Systemic Symptoms, and Threat to Life/Bodily Function  Patient is presenting with complaint of nausea, vomiting, diarrhea x7 hours.  Patient with screening labs suggestive of mild dehydration.  Patient significantly  improved after IV fluids and antiemetics administered.  Patient with improved chemistries on recheck after IV fluid resuscitation.  Patient is now taking good p.o.  She reports feeling significantly improved.  She desires DC home.  Patient will be prescribed Zofran for use at home.  She is encouraged to make sure that she is taking good p.o.  Importance of close follow-up is stressed.  Strict return precautions given and understood.   Co morbidities that complicated the patient's evaluation  Diabetes   Additional history obtained:  External records from outside sources obtained and reviewed including prior ED visits and prior Inpatient records.    Lab Tests:  I ordered and personally interpreted labs.  The pertinent results include: CBC, CMP, lipase, hCG   Cardiac Monitoring:  The patient was maintained on a cardiac monitor.  I personally viewed and interpreted the cardiac monitor which showed an underlying rhythm of: NSR   Medicines ordered:  I ordered medication including zofran  for nausea  Reevaluation of the patient after these medicines showed that the patient: improved   Problem List / ED Course:  Nausea, vomiting, diarrhea, dehydration, hyperglycemia   Reevaluation:  After the interventions noted above, I reevaluated the patient and found that they have: improved  Disposition:  After  consideration of the diagnostic results and the patients response to treatment, I feel that the patent would benefit from close outpatient follow-up.          Final Clinical Impression(s) / ED Diagnoses Final diagnoses:  Diarrhea, unspecified type  Nausea vomiting and diarrhea    Rx / DC Orders ED Discharge Orders          Ordered    ondansetron (ZOFRAN) 4 MG tablet  Every 6 hours        01/12/22 1037              Wynetta Fines, MD 01/12/22 1039

## 2022-01-12 NOTE — ED Triage Notes (Signed)
Pt states she has diffuse abdominal pain for the past 7 hours. Pt states she has been vomiting and had diarrhea. ?

## 2022-01-12 NOTE — Discharge Instructions (Signed)
Return for any problem.  ?

## 2022-01-12 NOTE — ED Notes (Signed)
Pt given diet ginger ale and water. 

## 2022-01-13 ENCOUNTER — Other Ambulatory Visit: Payer: Self-pay

## 2022-01-13 ENCOUNTER — Observation Stay (HOSPITAL_COMMUNITY)
Admission: EM | Admit: 2022-01-13 | Discharge: 2022-01-15 | Disposition: A | Discharge status: Home / Self Care | Payer: BC Managed Care – PPO | Attending: Emergency Medicine | Admitting: Emergency Medicine

## 2022-01-13 DIAGNOSIS — E1110 Type 2 diabetes mellitus with ketoacidosis without coma: Secondary | ICD-10-CM | POA: Diagnosis present

## 2022-01-13 DIAGNOSIS — K5730 Diverticulosis of large intestine without perforation or abscess without bleeding: Secondary | ICD-10-CM | POA: Insufficient documentation

## 2022-01-13 DIAGNOSIS — E0910 Drug or chemical induced diabetes mellitus with ketoacidosis without coma: Secondary | ICD-10-CM | POA: Diagnosis not present

## 2022-01-13 DIAGNOSIS — J45909 Unspecified asthma, uncomplicated: Secondary | ICD-10-CM | POA: Diagnosis not present

## 2022-01-13 DIAGNOSIS — R197 Diarrhea, unspecified: Secondary | ICD-10-CM

## 2022-01-13 DIAGNOSIS — Z794 Long term (current) use of insulin: Secondary | ICD-10-CM | POA: Diagnosis not present

## 2022-01-13 DIAGNOSIS — E860 Dehydration: Secondary | ICD-10-CM | POA: Diagnosis not present

## 2022-01-13 DIAGNOSIS — E8729 Other acidosis: Secondary | ICD-10-CM | POA: Diagnosis present

## 2022-01-13 DIAGNOSIS — F17210 Nicotine dependence, cigarettes, uncomplicated: Secondary | ICD-10-CM | POA: Diagnosis not present

## 2022-01-13 DIAGNOSIS — Z888 Allergy status to other drugs, medicaments and biological substances status: Secondary | ICD-10-CM | POA: Diagnosis not present

## 2022-01-13 DIAGNOSIS — F339 Major depressive disorder, recurrent, unspecified: Secondary | ICD-10-CM | POA: Diagnosis present

## 2022-01-13 DIAGNOSIS — Z79899 Other long term (current) drug therapy: Secondary | ICD-10-CM | POA: Diagnosis not present

## 2022-01-13 DIAGNOSIS — E876 Hypokalemia: Secondary | ICD-10-CM | POA: Diagnosis present

## 2022-01-13 DIAGNOSIS — R112 Nausea with vomiting, unspecified: Secondary | ICD-10-CM | POA: Insufficient documentation

## 2022-01-13 DIAGNOSIS — I10 Essential (primary) hypertension: Secondary | ICD-10-CM | POA: Insufficient documentation

## 2022-01-13 DIAGNOSIS — E871 Hypo-osmolality and hyponatremia: Secondary | ICD-10-CM | POA: Diagnosis not present

## 2022-01-13 DIAGNOSIS — Z20822 Contact with and (suspected) exposure to covid-19: Secondary | ICD-10-CM | POA: Insufficient documentation

## 2022-01-13 DIAGNOSIS — E119 Type 2 diabetes mellitus without complications: Secondary | ICD-10-CM

## 2022-01-13 DIAGNOSIS — R1084 Generalized abdominal pain: Secondary | ICD-10-CM

## 2022-01-13 DIAGNOSIS — E872 Acidosis, unspecified: Secondary | ICD-10-CM | POA: Diagnosis present

## 2022-01-13 NOTE — ED Triage Notes (Signed)
Pt arrives with c/o ABD pain. Pt was here yesterday with the same symptoms. Pt endorses n/v/d.  ?

## 2022-01-14 ENCOUNTER — Encounter (HOSPITAL_COMMUNITY): Payer: Self-pay

## 2022-01-14 ENCOUNTER — Emergency Department (HOSPITAL_COMMUNITY): Payer: BC Managed Care – PPO

## 2022-01-14 DIAGNOSIS — E871 Hypo-osmolality and hyponatremia: Secondary | ICD-10-CM

## 2022-01-14 DIAGNOSIS — R112 Nausea with vomiting, unspecified: Secondary | ICD-10-CM | POA: Diagnosis present

## 2022-01-14 DIAGNOSIS — E8729 Other acidosis: Secondary | ICD-10-CM | POA: Diagnosis not present

## 2022-01-14 DIAGNOSIS — E872 Acidosis, unspecified: Secondary | ICD-10-CM | POA: Diagnosis present

## 2022-01-14 DIAGNOSIS — E1110 Type 2 diabetes mellitus with ketoacidosis without coma: Secondary | ICD-10-CM | POA: Diagnosis present

## 2022-01-14 LAB — BLOOD GAS, VENOUS
Acid-base deficit: 13.7 mmol/L — ABNORMAL HIGH (ref 0.0–2.0)
Bicarbonate: 12.6 mmol/L — ABNORMAL LOW (ref 20.0–28.0)
O2 Saturation: 94.3 %
Patient temperature: 37
pCO2, Ven: 30 mmHg — ABNORMAL LOW (ref 44–60)
pH, Ven: 7.23 — ABNORMAL LOW (ref 7.25–7.43)
pO2, Ven: 68 mmHg — ABNORMAL HIGH (ref 32–45)

## 2022-01-14 LAB — BASIC METABOLIC PANEL
Anion gap: 8 (ref 5–15)
Anion gap: 4 — ABNORMAL LOW (ref 5–15)
Anion gap: 5 (ref 5–15)
BUN: 7 mg/dL (ref 6–20)
BUN: 6 mg/dL (ref 6–20)
BUN: <5 mg/dL — ABNORMAL LOW (ref 6–20)
CO2: 13 mmol/L — ABNORMAL LOW (ref 22–32)
CO2: 18 mmol/L — ABNORMAL LOW (ref 22–32)
CO2: 15 mmol/L — ABNORMAL LOW (ref 22–32)
Calcium: 6.8 mg/dL — ABNORMAL LOW (ref 8.9–10.3)
Calcium: 7.6 mg/dL — ABNORMAL LOW (ref 8.9–10.3)
Calcium: 7.4 mg/dL — ABNORMAL LOW (ref 8.9–10.3)
Chloride: 115 mmol/L — ABNORMAL HIGH (ref 98–111)
Chloride: 114 mmol/L — ABNORMAL HIGH (ref 98–111)
Chloride: 114 mmol/L — ABNORMAL HIGH (ref 98–111)
Creatinine, Ser: 0.45 mg/dL (ref 0.44–1.00)
Creatinine, Ser: 0.42 mg/dL — ABNORMAL LOW (ref 0.44–1.00)
Creatinine, Ser: 0.4 mg/dL — ABNORMAL LOW (ref 0.44–1.00)
GFR, Estimated: >60 mL/min (ref >60)
GFR, Estimated: >60 mL/min (ref >60)
GFR, Estimated: >60 mL/min (ref >60)
Glucose, Bld: 85 mg/dL (ref 70–99)
Glucose, Bld: 131 mg/dL — ABNORMAL HIGH (ref 70–99)
Glucose, Bld: 122 mg/dL — ABNORMAL HIGH (ref 70–99)
Potassium: 3.3 mmol/L — ABNORMAL LOW (ref 3.5–5.1)
Potassium: 3.5 mmol/L (ref 3.5–5.1)
Potassium: 3.8 mmol/L (ref 3.5–5.1)
Sodium: 136 mmol/L (ref 135–145)
Sodium: 136 mmol/L (ref 135–145)
Sodium: 134 mmol/L — ABNORMAL LOW (ref 135–145)

## 2022-01-14 LAB — CBC WITH DIFFERENTIAL/PLATELET
Abs Immature Granulocytes: 0.01 10*3/uL (ref 0.00–0.07)
Basophils Absolute: 0 10*3/uL (ref 0.0–0.1)
Basophils Relative: 1 %
Eosinophils Absolute: 0 10*3/uL (ref 0.0–0.5)
Eosinophils Relative: 0 %
HCT: 48.1 % — ABNORMAL HIGH (ref 36.0–46.0)
Hemoglobin: 15.1 g/dL — ABNORMAL HIGH (ref 12.0–15.0)
Immature Granulocytes: 0 %
Lymphocytes Relative: 12 %
Lymphs Abs: 0.5 10*3/uL — ABNORMAL LOW (ref 0.7–4.0)
MCH: 29.4 pg (ref 26.0–34.0)
MCHC: 31.4 g/dL (ref 30.0–36.0)
MCV: 93.8 fL (ref 80.0–100.0)
Monocytes Absolute: 0.5 10*3/uL (ref 0.1–1.0)
Monocytes Relative: 10 %
Neutro Abs: 3.4 10*3/uL (ref 1.7–7.7)
Neutrophils Relative %: 77 %
Platelets: 205 10*3/uL (ref 150–400)
RBC: 5.13 MIL/uL — ABNORMAL HIGH (ref 3.87–5.11)
RDW: 11.9 % (ref 11.5–15.5)
WBC: 4.4 10*3/uL (ref 4.0–10.5)
nRBC: 0 % (ref 0.0–0.2)

## 2022-01-14 LAB — URINALYSIS, ROUTINE W REFLEX MICROSCOPIC
Bacteria, UA: NONE SEEN
Bilirubin Urine: NEGATIVE
Glucose, UA: >=500 mg/dL — AB
Hgb urine dipstick: NEGATIVE
Ketones, ur: 80 mg/dL — AB
Leukocytes,Ua: NEGATIVE
Nitrite: NEGATIVE
Protein, ur: NEGATIVE mg/dL
Specific Gravity, Urine: >1.046 — ABNORMAL HIGH (ref 1.005–1.030)
pH: 5 (ref 5.0–8.0)

## 2022-01-14 LAB — COMPREHENSIVE METABOLIC PANEL
ALT: 44 U/L (ref 0–44)
AST: 26 U/L (ref 15–41)
Albumin: 3.5 g/dL (ref 3.5–5.0)
Alkaline Phosphatase: 51 U/L (ref 38–126)
Anion gap: 13 (ref 5–15)
BUN: 11 mg/dL (ref 6–20)
CO2: 12 mmol/L — ABNORMAL LOW (ref 22–32)
Calcium: 7.9 mg/dL — ABNORMAL LOW (ref 8.9–10.3)
Chloride: 104 mmol/L (ref 98–111)
Creatinine, Ser: 0.56 mg/dL (ref 0.44–1.00)
GFR, Estimated: >60 mL/min (ref >60)
Glucose, Bld: 157 mg/dL — ABNORMAL HIGH (ref 70–99)
Potassium: 3.6 mmol/L (ref 3.5–5.1)
Sodium: 129 mmol/L — ABNORMAL LOW (ref 135–145)
Total Bilirubin: 0.9 mg/dL (ref 0.3–1.2)
Total Protein: 6.6 g/dL (ref 6.5–8.1)

## 2022-01-14 LAB — BETA-HYDROXYBUTYRIC ACID
Beta-Hydroxybutyric Acid: 3.14 mmol/L — ABNORMAL HIGH (ref 0.05–0.27)
Beta-Hydroxybutyric Acid: 1.4 mmol/L — ABNORMAL HIGH (ref 0.05–0.27)
Beta-Hydroxybutyric Acid: 0.09 mmol/L (ref 0.05–0.27)

## 2022-01-14 LAB — GLUCOSE, CAPILLARY
Glucose-Capillary: 67 mg/dL — ABNORMAL LOW (ref 70–99)
Glucose-Capillary: 123 mg/dL — ABNORMAL HIGH (ref 70–99)
Glucose-Capillary: 135 mg/dL — ABNORMAL HIGH (ref 70–99)
Glucose-Capillary: 175 mg/dL — ABNORMAL HIGH (ref 70–99)
Glucose-Capillary: 129 mg/dL — ABNORMAL HIGH (ref 70–99)
Glucose-Capillary: 140 mg/dL — ABNORMAL HIGH (ref 70–99)
Glucose-Capillary: 151 mg/dL — ABNORMAL HIGH (ref 70–99)
Glucose-Capillary: 148 mg/dL — ABNORMAL HIGH (ref 70–99)
Glucose-Capillary: 170 mg/dL — ABNORMAL HIGH (ref 70–99)
Glucose-Capillary: 120 mg/dL — ABNORMAL HIGH (ref 70–99)
Glucose-Capillary: 91 mg/dL (ref 70–99)
Glucose-Capillary: 111 mg/dL — ABNORMAL HIGH (ref 70–99)
Glucose-Capillary: 134 mg/dL — ABNORMAL HIGH (ref 70–99)

## 2022-01-14 LAB — RESP PANEL BY RT-PCR (FLU A&B, COVID) ARPGX2
Influenza A by PCR: NEGATIVE
Influenza B by PCR: NEGATIVE
SARS Coronavirus 2 by RT PCR: NEGATIVE

## 2022-01-14 LAB — VITAMIN D 25 HYDROXY (VIT D DEFICIENCY, FRACTURES): Vit D, 25-Hydroxy: 34.82 ng/mL (ref 30–100)

## 2022-01-14 LAB — CBG MONITORING, ED: Glucose-Capillary: 158 mg/dL — ABNORMAL HIGH (ref 70–99)

## 2022-01-14 LAB — MAGNESIUM: Magnesium: 1.8 mg/dL (ref 1.7–2.4)

## 2022-01-14 LAB — LIPASE, BLOOD: Lipase: 20 U/L (ref 11–51)

## 2022-01-14 LAB — MRSA NEXT GEN BY PCR, NASAL: MRSA by PCR Next Gen: NOT DETECTED

## 2022-01-14 LAB — LACTIC ACID, PLASMA: Lactic Acid, Venous: 0.6 mmol/L (ref 0.5–1.9)

## 2022-01-14 MED ORDER — POTASSIUM CHLORIDE 10 MEQ/100ML IV SOLN
10.0000 meq | INTRAVENOUS | Status: AC
  Administered 2022-01-14 (×3): 10 meq via INTRAVENOUS
  Filled 2022-01-14 (×3): qty 100

## 2022-01-14 MED ORDER — PANTOPRAZOLE SODIUM 40 MG PO TBEC: 40.0000 mg | DELAYED_RELEASE_TABLET | Freq: Every day | ORAL | 0 refills | Status: AC

## 2022-01-14 MED ORDER — ALUM & MAG HYDROXIDE-SIMETH 200-200-20 MG/5ML PO SUSP
30.0000 mL | Freq: Once | ORAL | Status: DC
  Filled 2022-01-14: qty 30

## 2022-01-14 MED ORDER — ONDANSETRON HCL 4 MG/2ML IJ SOLN
4.0000 mg | Freq: Once | INTRAMUSCULAR | Status: AC
  Administered 2022-01-14: 4 mg via INTRAVENOUS
  Filled 2022-01-14: qty 2

## 2022-01-14 MED ORDER — DEXTROSE 10 % IV SOLN: INTRAVENOUS | Status: DC

## 2022-01-14 MED ORDER — INSULIN REGULAR(HUMAN) IN NACL 100-0.9 UT/100ML-% IV SOLN
INTRAVENOUS | Status: DC
  Administered 2022-01-14: 1 [IU]/h via INTRAVENOUS
  Filled 2022-01-14: qty 100

## 2022-01-14 MED ORDER — HYOSCYAMINE SULFATE 0.125 MG PO TBDP: 0.1250 mg | ORAL_TABLET | Freq: Once | ORAL | Status: DC

## 2022-01-14 MED ORDER — HYOSCYAMINE SULFATE 0.125 MG PO TABS: 0.1250 mg | ORAL_TABLET | Freq: Three times a day (TID) | ORAL | 0 refills | Status: AC | PRN

## 2022-01-14 MED ORDER — ACETAMINOPHEN 325 MG PO TABS: 650.0000 mg | ORAL_TABLET | Freq: Four times a day (QID) | ORAL | Status: DC | PRN

## 2022-01-14 MED ORDER — SODIUM CHLORIDE 0.9 % IV BOLUS
2000.0000 mL | Freq: Once | INTRAVENOUS | Status: AC
  Administered 2022-01-14: 2000 mL via INTRAVENOUS

## 2022-01-14 MED ORDER — DEXTROSE 50 % IV SOLN: 25.0000 g | INTRAVENOUS | Status: DC | PRN

## 2022-01-14 MED ORDER — MONTELUKAST SODIUM 10 MG PO TABS
10.0000 mg | ORAL_TABLET | Freq: Every day | ORAL | Status: DC
  Administered 2022-01-14: 10 mg via ORAL
  Filled 2022-01-14: qty 1

## 2022-01-14 MED ORDER — BUPROPION HCL ER (XL) 300 MG PO TB24
300.0000 mg | ORAL_TABLET | Freq: Every day | ORAL | Status: DC
  Administered 2022-01-14 – 2022-01-15 (×2): 300 mg via ORAL
  Filled 2022-01-14 (×2): qty 1

## 2022-01-14 MED ORDER — HYDROXYZINE HCL 25 MG PO TABS
50.0000 mg | ORAL_TABLET | Freq: Two times a day (BID) | ORAL | Status: DC
  Administered 2022-01-14 – 2022-01-15 (×3): 50 mg via ORAL
  Filled 2022-01-14 (×3): qty 2

## 2022-01-14 MED ORDER — LIDOCAINE VISCOUS HCL 2 % MT SOLN
15.0000 mL | Freq: Once | OROMUCOSAL | Status: AC
  Administered 2022-01-14: 15 mL via ORAL
  Filled 2022-01-14: qty 15

## 2022-01-14 MED ORDER — PANTOPRAZOLE SODIUM 40 MG IV SOLR
40.0000 mg | INTRAVENOUS | Status: DC
  Administered 2022-01-14 – 2022-01-15 (×2): 40 mg via INTRAVENOUS
  Filled 2022-01-14 (×2): qty 10

## 2022-01-14 MED ORDER — TRAZODONE HCL 50 MG PO TABS
150.0000 mg | ORAL_TABLET | Freq: Every day | ORAL | Status: DC
  Administered 2022-01-14: 150 mg via ORAL
  Filled 2022-01-14: qty 3

## 2022-01-14 MED ORDER — IOHEXOL 300 MG/ML  SOLN
100.0000 mL | Freq: Once | INTRAMUSCULAR | Status: AC | PRN
  Administered 2022-01-14: 100 mL via INTRAVENOUS

## 2022-01-14 MED ORDER — FAMOTIDINE IN NACL 20-0.9 MG/50ML-% IV SOLN
20.0000 mg | Freq: Once | INTRAVENOUS | Status: AC
  Administered 2022-01-14: 20 mg via INTRAVENOUS
  Filled 2022-01-14: qty 50

## 2022-01-14 MED ORDER — GUANFACINE HCL ER 1 MG PO TB24
2.0000 mg | ORAL_TABLET | Freq: Every day | ORAL | Status: DC
  Administered 2022-01-14 – 2022-01-15 (×2): 2 mg via ORAL
  Filled 2022-01-14 (×2): qty 2

## 2022-01-14 MED ORDER — CHLORHEXIDINE GLUCONATE CLOTH 2 % EX PADS
6.0000 | MEDICATED_PAD | Freq: Every day | CUTANEOUS | Status: DC
  Administered 2022-01-14: 6 via TOPICAL

## 2022-01-14 MED ORDER — DEXTROSE 50 % IV SOLN
25.0000 g | Freq: Once | INTRAVENOUS | Status: AC
  Administered 2022-01-14: 25 g via INTRAVENOUS
  Filled 2022-01-14: qty 50

## 2022-01-14 MED ORDER — ONDANSETRON HCL 4 MG/2ML IJ SOLN
4.0000 mg | Freq: Four times a day (QID) | INTRAMUSCULAR | Status: DC | PRN
  Administered 2022-01-14: 4 mg via INTRAVENOUS
  Filled 2022-01-14: qty 2

## 2022-01-14 MED ORDER — MAGNESIUM SULFATE 2 GM/50ML IV SOLN
2.0000 g | Freq: Once | INTRAVENOUS | Status: AC
  Administered 2022-01-14: 2 g via INTRAVENOUS
  Filled 2022-01-14: qty 50

## 2022-01-14 MED ORDER — SODIUM CHLORIDE (PF) 0.9 % IJ SOLN
INTRAMUSCULAR | Status: AC
  Filled 2022-01-14: qty 50

## 2022-01-14 MED ORDER — LISINOPRIL 2.5 MG PO TABS
2.5000 mg | ORAL_TABLET | Freq: Every day | ORAL | Status: DC
  Administered 2022-01-14 – 2022-01-15 (×2): 2.5 mg via ORAL
  Filled 2022-01-14 (×2): qty 1

## 2022-01-14 MED ORDER — HYOSCYAMINE SULFATE 0.125 MG SL SUBL
0.1250 mg | SUBLINGUAL_TABLET | Freq: Once | SUBLINGUAL | Status: AC
Start: 2022-01-14 — End: 2022-01-14
  Administered 2022-01-14: 0.125 mg via SUBLINGUAL
  Filled 2022-01-14: qty 1

## 2022-01-14 MED ORDER — DICYCLOMINE HCL 10 MG PO CAPS
20.0000 mg | ORAL_CAPSULE | Freq: Once | ORAL | Status: AC
  Administered 2022-01-14: 20 mg via ORAL
  Filled 2022-01-14: qty 2

## 2022-01-14 MED ORDER — ONDANSETRON HCL 4 MG PO TABS: 4.0000 mg | ORAL_TABLET | Freq: Four times a day (QID) | ORAL | Status: DC | PRN

## 2022-01-14 MED ORDER — CITALOPRAM HYDROBROMIDE 20 MG PO TABS
40.0000 mg | ORAL_TABLET | Freq: Every day | ORAL | Status: DC
  Administered 2022-01-14 – 2022-01-15 (×2): 40 mg via ORAL
  Filled 2022-01-14 (×2): qty 2

## 2022-01-14 MED ORDER — INSULIN ASPART 100 UNIT/ML IV SOLN
5.0000 [IU] | Freq: Once | INTRAVENOUS | Status: DC
  Filled 2022-01-14: qty 0.05

## 2022-01-14 MED ORDER — FLUCONAZOLE 150 MG PO TABS
150.0000 mg | ORAL_TABLET | Freq: Once | ORAL | Status: AC
  Administered 2022-01-14: 150 mg via ORAL
  Filled 2022-01-14: qty 1

## 2022-01-14 MED ORDER — PROMETHAZINE HCL 25 MG PO TABS: 25.0000 mg | ORAL_TABLET | Freq: Three times a day (TID) | ORAL | 0 refills | Status: AC | PRN

## 2022-01-14 MED ORDER — ATORVASTATIN CALCIUM 40 MG PO TABS
40.0000 mg | ORAL_TABLET | Freq: Every day | ORAL | Status: DC
  Administered 2022-01-14 – 2022-01-15 (×2): 40 mg via ORAL
  Filled 2022-01-14 (×2): qty 1

## 2022-01-14 MED ORDER — ACETAMINOPHEN 650 MG RE SUPP: 650.0000 mg | Freq: Four times a day (QID) | RECTAL | Status: DC | PRN

## 2022-01-14 MED ORDER — FENTANYL CITRATE PF 50 MCG/ML IJ SOSY
50.0000 ug | PREFILLED_SYRINGE | Freq: Once | INTRAMUSCULAR | Status: AC
  Administered 2022-01-14: 50 ug via INTRAVENOUS
  Filled 2022-01-14: qty 1

## 2022-01-14 MED ORDER — HYDROMORPHONE HCL 1 MG/ML IJ SOLN: 1.0000 mg | INTRAMUSCULAR | Status: DC | PRN

## 2022-01-14 MED ORDER — KCL IN DEXTROSE-NACL 20-5-0.9 MEQ/L-%-% IV SOLN
INTRAVENOUS | Status: DC
  Filled 2022-01-14 (×2): qty 1000

## 2022-01-14 NOTE — ED Provider Notes (Signed)
Care assumed from [facility] at shift change, please see their note for full detail, but in brief Rita Hogan is a 36 y.o. female presents with 3-day history of diarrhea and vomiting in the setting of similar type 2 diabetes. ? ?Plan: Pending repeat BMP.  Patient's initial bicarb was 12.  After treatment, expecting this to increase to at least 15 which would indicate stability for discharge. ? ?Physical Exam  ?BP 109/79   Pulse 85   Temp 98 ?F (36.7 ?C) (Oral)   Resp 18   Ht 5\' 1"  (1.549 m)   Wt 103.4 kg   SpO2 98%   BMI 43.08 kg/m?  ? ?Physical Exam ? ?Procedures  ?Procedures ? ?ED Course / MDM  ?  ?Medical Decision Making ?Amount and/or Complexity of Data Reviewed ?Labs: ordered. ?Radiology: ordered. ? ?Risk ?OTC drugs. ?Prescription drug management. ?Decision regarding hospitalization. ? ? ?I individually viewed and interpreted all labs and imaging.  Repeat BMP with bicarb of 13.  Her urinalysis shows significant ketones and glucose.  CT unremarkable.  I also chart reviewed patient's previous visits to the ED and viewed her labs from then as well.  At that time, her bicarb was between 17-20.  I feel that if patient is discharged home today without complete resolution of her GI symptoms, her acidosis will continue to worsen, and she will likely end up returning back here to the emergency department.  Given patient's symptoms and diagnostic results, I feel that she would benefit from admission. ? ?Spoke with Dr. 01-14-1979 who agrees to admit patient.  Discussed plan with patient and she is amenable to admission as well.  Ultimate treatment and disposition to be determined by admitting provider. ? ? ? ? ?  ?Robb Matar, Janell Quiet ?01/14/22 03/16/22 ? ?  ?[id number], MD ?01/14/22 [phone] ? ?

## 2022-01-14 NOTE — Progress Notes (Signed)
Upon patient's arrival to [facility], CBG was 67. 1 amp D5 given per ordered. Dr Olevia Bowens notified by RN that insulin gtt was being held until hypoglycemia improved. CBG now 135. Diabetes coordinator is currently speaking with pt. RN will start insulin gtt when discussion is through.  ?

## 2022-01-14 NOTE — Progress Notes (Addendum)
Inpatient Diabetes Program Recommendations ? ?AACE/ADA: New Consensus Statement on Inpatient Glycemic Control (2015) ? ?Target Ranges:  Prepandial:   less than 140 mg/dL ?     Peak postprandial:   less than 180 mg/dL (1-2 hours) ?     Critically ill patients:  140 - 180 mg/dL  ? ?Lab Results  ?Component Value Date  ? GLUCAP 123 (H) 01/14/2022  ? HGBA1C 9.2 (H) 02/26/2017  ? ? ?Review of Glycemic Control ? Latest Reference Range & Units 01/14/22 01:20 01/14/22 11:01 01/14/22 12:04 01/14/22 12:23  ?Glucose-Capillary 70 - 99 mg/dL 194 (H) 67 (L) 174 (H) 123 (H)  ?(H): Data is abnormally high ?(L): Data is abnormally low ? Latest Reference Range & Units 01/14/22 08:02  ?Beta-Hydroxybutyric Acid 0.05 - 0.27 mmol/L 3.14 (H)  ?(H): Data is abnormally high ? Latest Reference Range & Units 01/14/22 06:32  ?CO2 22 - 32 mmol/L 13 (L)  ?(L): Data is abnormally low ? ? Latest Reference Range & Units 01/14/22 06:32  ?Potassium 3.5 - 5.1 mmol/L 3.3 (L)  ?(L): Data is abnormally low ? ?Diabetes history: DM2 ?Outpatient Diabetes medications: Glipizide 10 mg BID, Victoza 1.8 mg QD, Synjardy 12.03-999 mg BID ?Current orders for Inpatient glycemic control: IV insulin with D10 @ 50 ml/hr, replacing K(+) ? ?Spoke with patient at bedside.  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  She was diagnosed with DM2 at age 36.  She was started on insulin at that time.  She was off and on insulin for a few years.  She was not well controlled and was placed on a U500 trial at one point.  She gained 100 lbs while on insulin at one point.  She refuses insulin due to weight gain.  She is current with her PA at [facility].  She confirms above home medications.  She was hospitalized in 2018 with DKA.  At that time she was on Invokana.  She follows a low carb diet.  She also takes Vyvance which greatly suppresses her appetite.  She was started on Vyvance 1.5 yrs ago.   ? ?She states she is going to be stopping her  Victoza and starting Ozempic on Friday.  She is excited about this because she will only need 1 shot a week verses everyday. ? ?Explained to her how SGLT2i's work and how they can increase risk of DKA.  Expect acidosis to clear slowly due to SGLT2i.  Asked MD for D10.   ? ?Please also consider obtaining a current A1C & BMET  Q4 hours until acidosis is cleared. ? ?Moving forward for discharge, please consider discontinuing Synjardy and starting Metformin. ? ?Will continue to follow while inpatient. ? ?Thank you, ?Dulce Sellar, MSN, RN ?Diabetes Coordinator ?Inpatient Diabetes Program ?[phone] (team pager from 8a-5p) ? ? ? ? ? ? ? ?

## 2022-01-14 NOTE — Plan of Care (Signed)
This RN will continue to monitor patient's progression of care plan.  

## 2022-01-14 NOTE — ED Provider Notes (Signed)
[facility]-EMERGENCY DEPT Provider Note   CSN: [id number] Arrival date & time: 01/13/22  2338     History  Chief Complaint  Patient presents with   Abdominal Pain    Rita Hogan is a 36 y.o. female with a history of hypertension, hyperlipidemia, and T2DM who returns to the ED for continued n/v/d & abdominal pain.  Patient reports she has been feeling poorly for 3 days now with nausea, multiple episodes of emesis/dry heaves per day, and too numerous to count episodes of diarrhea per day.  She is having associated generalized abdominal pain that is crampy and burning in nature, currently a 7 out of 10 in severity, no alleviating or aggravating factors.  She states that she was seen in the emergency department and received fluids in the ED and was ultimately feeling improved and able to be discharged, since going home she has been taking Zofran without relief of her vomiting.  Her most recent dose was around 7 PM.  She does have problems with chronic diarrhea, she has seen a doctor GI doctor.  She denies recent foreign travel, hospitalization, or antibiotic use.  She denies fever, hematemesis, melena, hematochezia, dysuria, or syncope.  HPI     Home Medications Prior to Admission medications   Medication Sig Start Date End Date Taking? Authorizing Provider  B-D ULTRAFINE III SHORT PEN 31G X 8 MM MISC See admin instructions. 03/11/17   [provider]  benazepril (LOTENSIN) 40 MG tablet Take 1 tablet (40 mg total) by mouth daily. 02/26/17   Myrlene Brokerrawford, Elizabeth A, MD  buPROPion (WELLBUTRIN XL) 300 MG 24 hr tablet TAKE 1 TABLET(300 MG) BY MOUTH DAILY 02/26/17   Myrlene Brokerrawford, Elizabeth A, MD  canagliflozin [facility](INVOKANA) 300 MG TABS tablet Take 1 tablet (300 mg total) by mouth daily before breakfast. Reported on 12/24/2015 02/26/17   Myrlene Brokerrawford, Elizabeth A, MD  cetirizine (ZYRTEC) 10 MG tablet Take 1 tablet (10 mg total) by mouth daily. 05/24/17   Nche, Bonna Gainsharlotte Lum, NP   citalopram (CELEXA) 40 MG tablet TAKE 1 TABLET(40 MG) BY MOUTH DAILY 02/26/17   Myrlene Brokerrawford, Elizabeth A, MD  Diclofenac Sodium (PENNSAID) 2 % SOLN Place 1 application onto the skin 2 (two) times daily. 05/25/17   Andrena Mewsigby, Michael D, DO  fluconazole (DIFLUCAN) 150 MG tablet TAKE 1 TABLET(150 MG) BY MOUTH EVERY 3 DAYS 05/14/17   Myrlene Brokerrawford, Elizabeth A, MD  fluticasone [facility](FLONASE) 50 MCG/ACT nasal spray Place 2 sprays into both nostrils daily. 05/24/17   Nche, Bonna Gainsharlotte Lum, NP  glimepiride (AMARYL) 4 MG tablet Take 1 tablet (4 mg total) by mouth daily with breakfast. 02/26/17   Myrlene Brokerrawford, Elizabeth A, MD  glucose blood (ACCU-CHEK AVIVA PLUS) test strip 1 each by Other route 2 (two) times daily. Use to check blood sugar twice a day. Dx E11.9 08/27/16   Myrlene Brokerrawford, Elizabeth A, MD  hydrOXYzine (VISTARIL) 25 MG capsule TAKE 2 CAPSULES(50 MG) BY MOUTH EVERY 6 HOURS AS NEEDED FOR ANXIETY 05/14/17   Myrlene Brokerrawford, Elizabeth A, MD  Insulin Pen Needle (SURE COMFORT PEN NEEDLES) 30G X 8 MM MISC Use for injections 08/04/16   Myrlene Brokerrawford, Elizabeth A, MD  liraglutide (VICTOZA) 18 MG/3ML SOPN ADMINISTER 1.8 MG UNDER THE SKIN EVERY DAY 02/26/17   Myrlene Brokerrawford, Elizabeth A, MD  montelukast (SINGULAIR) 10 MG tablet Take 1 tablet (10 mg total) by mouth daily. 04/15/16   Myrlene Brokerrawford, Elizabeth A, MD  ondansetron (ZOFRAN) 4 MG tablet Take 1 tablet (4 mg total) by mouth every 6 (six) hours.  01/12/22   Wynetta Fines, MD  rosuvastatin (CRESTOR) 20 MG tablet Take 1 tablet (20 mg total) by mouth daily. Reported on 12/24/2015 04/15/16   Myrlene Broker, MD  sodium chloride (OCEAN) 0.65 % SOLN nasal spray Place 1 spray into both nostrils as needed for congestion. 05/24/17   Nche, Bonna Gains, NP  Suvorexant (BELSOMRA) 10 MG TABS Take 1 tablet by mouth at bedtime as needed. 04/23/17   NcheBonna Gains, NP  traZODone (DESYREL) 50 MG tablet  05/14/17   [provider]      Allergies    Metformin and related, Other, and Oxycodone    Review of  Systems   Review of Systems  Constitutional:  Negative for fever.  Respiratory:  Negative for shortness of breath.   Cardiovascular:  Negative for chest pain.  Gastrointestinal:  Positive for abdominal pain, diarrhea, nausea and vomiting. Negative for blood in stool.  Genitourinary:  Negative for dysuria, vaginal bleeding and vaginal discharge.  Neurological:  Negative for syncope.  All other systems reviewed and are negative.  Physical Exam Updated Vital Signs BP 123/60 (BP Location: Left Arm)    Pulse 92    Temp 98 F (36.7 C) (Oral)    Resp 16    Ht 5\' 1"  (1.549 m)    Wt 103.4 kg    SpO2 100%    BMI 43.08 kg/m  Physical Exam Vitals and nursing note reviewed.  Constitutional:      General: She is not in acute distress.    Appearance: She is well-developed. She is not toxic-appearing.  HENT:     Head: Normocephalic and atraumatic.     Mouth/Throat:     Comments: Dry MM Eyes:     General:        Right eye: No discharge.        Left eye: No discharge.     Conjunctiva/sclera: Conjunctivae normal.  Cardiovascular:     Rate and Rhythm: Normal rate and regular rhythm.  Pulmonary:     Effort: Pulmonary effort is normal. No respiratory distress.     Breath sounds: Normal breath sounds. No wheezing, rhonchi or rales.  Abdominal:     General: There is no distension.     Palpations: Abdomen is soft.     Tenderness: There is generalized abdominal tenderness. There is no guarding or rebound.  Musculoskeletal:     Cervical back: Neck supple.  Skin:    General: Skin is warm and dry.     Findings: No rash.  Neurological:     Mental Status: She is alert.     Comments: Clear speech.   Psychiatric:        Behavior: Behavior normal.    ED Results / Procedures / Treatments   Labs (all labs ordered are listed, but only abnormal results are displayed) Labs Reviewed  CBG MONITORING, ED - Abnormal; Notable for the following components:      Result Value   Glucose-Capillary 158 (*)     All other components within normal limits  RESP PANEL BY RT-PCR (FLU A&B, COVID) ARPGX2  COMPREHENSIVE METABOLIC PANEL  LIPASE, BLOOD  CBC WITH DIFFERENTIAL/PLATELET  URINALYSIS, ROUTINE W REFLEX MICROSCOPIC    EKG None  Radiology CT Abdomen Pelvis W Contrast  Result Date: 01/14/2022 CLINICAL DATA:  Initial evaluation for acute nonlocalized abdominal pain. EXAM: CT ABDOMEN AND PELVIS WITH CONTRAST TECHNIQUE: Multidetector CT imaging of the abdomen and pelvis was performed using the standard protocol following bolus administration  of intravenous contrast. RADIATION DOSE REDUCTION: This exam was performed according to the departmental dose-optimization program which includes automated exposure control, adjustment of the mA and/or kV according to patient size and/or use of iterative reconstruction technique. CONTRAST:  OMNIPAQUE IOHEXOL 300 MG/ML  SOLN COMPARISON:  Prior ultrasound from 10/16/2010 and CT from 12/12/2021. FINDINGS: Lower chest: Visualized lung bases are clear. Hepatobiliary: Liver demonstrates a normal contrast enhanced appearance. Gallbladder within normal limits. No biliary dilatation. Pancreas: Unremarkable. No pancreatic ductal dilatation or surrounding inflammatory changes. Spleen: Normal in size without focal abnormality. Adrenals/Urinary Tract: Adrenal glands within normal limits. Kidneys equal in size with symmetric enhancement. No nephrolithiasis, hydronephrosis, focal renal mass. No hydroureter or visible ureterolithiasis. Partially distended bladder within normal limits. Stomach/Bowel: Stomach within normal limits. No evidence for bowel obstruction. Appendix within normal limits. Mild sigmoid diverticulosis without evidence for acute diverticulitis. Intraluminal fluid density noted within the distal colon, which can be seen with active diarrheal illness. Mid no other acute inflammatory changes seen about the bowels. Vascular/Lymphatic: Mild aorto bi-iliac atherosclerotic  disease. No aneurysm. Mesenteric vessels patent proximally. No adenopathy. Reproductive: Uterus and ovaries within normal limits for age. Probable small follicle noted within the right ovary. Other: No free air or fluid. Musculoskeletal: External soft tissues demonstrate no acute finding. No acute osseous finding. No worrisome osseous lesions. Benign bone island noted within the right iliac wing. IMPRESSION: 1. Intraluminal fluid density within the distal colon, which can be seen with active diarrheal illness. 2. Mild sigmoid diverticulosis without evidence for acute diverticulitis. 3. No other acute intra-abdominal or pelvic process. Aortic Atherosclerosis (ICD10-I70.0). Electronically Signed   By: Rise Mu M.D.   On: 01/14/2022 03:57    Procedures Procedures    Medications Ordered in ED Medications  sodium chloride 0.9 % bolus 2,000 mL (has no administration in time range)  ondansetron (ZOFRAN) injection 4 mg (has no administration in time range)  famotidine (PEPCID) IVPB 20 mg premix (has no administration in time range)    ED Course/ Medical Decision Making/ A&P                           Medical Decision Making Amount and/or Complexity of Data Reviewed Labs: ordered. Radiology: ordered.  Risk OTC drugs. Prescription drug management.   Patient presents to the ED with complaints of continued N/V/D & abdominal pain, this involves an extensive number of treatment options, and is a complaint that carries with it a high risk of complications and morbidity. Nontoxic, vitals without significant abnormality. Dry MM, diffuse abdominal tenderness.     Additional history obtained:  Chart review and nursing note review, specifically reviewed most recent ED visit-patient found to be hyperglycemic with mildly low bicarb and mild elevation in anion gap, these findings improved following fluids in the ED.  Lab Tests:  I reviewed & interpreted labs including:  CBG: 158.  CBC:  elevated hgb/hct  CMP: mild hyperglycemia. Bicarb is low @ 12, no anion gap elevation.  Lipase: WNL COVID/Flu: Negative.   On reassessment patient with continued pain following Zofran and Pepcid, will give fentanyl and obtain CT Abdo/pelvis with contrast  Imaging Studies ordered:  I ordered and viewed the following imaging, agree with radiologist impression:  CT abdomen/pelvis:1. Intraluminal fluid density within the distal colon, which can be seen with active diarrheal illness. 2. Mild sigmoid diverticulosis without evidence for acute diverticulitis. 3. No other acute intra-abdominal or pelvic process. Aortic Atherosclerosis  CT abdomen/pelvis without  acute surgical process.  Patient with improved nausea, still has some abdominal discomfort, plan is to trial additional supportive care medications.  She continues to receive fluids.  Given her low bicarb we will repeat a BMP with fluid completion.  06:30: Patient care signed out to PA Conklin at change of shift pending repeat BMP and disposition.  If bicarb is improved and patient is tolerating p.o. feel she would be reasonable for discharge.  Findings and plan of care discussed with supervising physician Dr. Judd Lien who is in agreement.   Portions of this note were generated with Scientist, clinical (histocompatibility and immunogenetics). Dictation errors may occur despite best attempts at proofreading.         Final Clinical Impression(s) / ED Diagnoses Final diagnoses:  Nausea vomiting and diarrhea  Generalized abdominal pain  Dehydration    Rx / DC Orders ED Discharge Orders     None         Cherly Anderson, PA-C 01/14/22 6222    Geoffery Lyons, MD 01/18/22 [phone]

## 2022-01-14 NOTE — Discharge Instructions (Addendum)
You were seen in the emergency department today for nausea, vomiting, diarrhea, and abdominal pain.  Your work-up showed that your bicarb lab test was low which we suspect is secondary to dehydration, you were given fluids with improvement.  The remainder of your labs were fairly reassuring.  Your urine had some ketones and glucose in it which we suspect is again due to dehydration. ? ? ?Your CT scan showed findings consistent with a diarrheal illness, UTI diverticulosis but no active diverticulitis infection, you do have some plaque in your aorta.  Please discuss these findings with your primary care provider. ? ? ?Please take Phenergan every 8 hours as needed for nausea and vomiting, Protonix daily prior to meals to help with stomach acidity, and levsin/hyoscyamine every  8 hours as needed for abdominal pain.  ? ? ?We have prescribed you new medication(s) today. Discuss the medications prescribed today with your pharmacist as they can have adverse effects and interactions with your other medicines including over the counter and prescribed medications. Seek medical evaluation if you start to experience new or abnormal symptoms after taking one of these medicines, seek care immediately if you start to experience difficulty breathing, feeling of your throat closing, facial swelling, or rash as these could be indications of a more serious allergic reaction ? ?Please follow attached diet guidelines to help with diarrhea.  Please follow-up with primary care soon as possible and also schedule a GI follow-up.  Return to the ER for new or worsening symptoms including but not limited to new worsening pain, fever, blood in vomit or stool, inability to keep fluids down, passing out, or any other concerns. ?

## 2022-01-14 NOTE — Progress Notes (Addendum)
Per Dr Lavetta Nielsen verbal order, insulin gtt started at 1 unit/hr with D5NS with K. 1 amp D5 given prior to initiation of infusion per orders. This RN will continue to check CBG Q1H as ordered, implementing endotool as able. Insulin gtt is to remain infusing to correct acidosis, unless hypoglycemia occurs. This RN will continue to carefully monitor CBGs.  ?

## 2022-01-14 NOTE — H&P (Signed)
History and Physical    Patient: Rita Hogan ZOX:[id number] DOB: 1986-02-04 DOA: 01/13/2022 DOS: the patient was seen and examined on 01/14/2022 PCP: Judd Lien, PA-C  Patient coming from: Home  Chief Complaint:  Chief Complaint  Patient presents with   Abdominal Pain   HPI: Rita Hogan is a 36 y.o. female with medical history significant of allergy, anxiety, depression, asthma, type 2 diabetes, heart murmur, hyperlipidemia, hypertension, class III obesity who is returning to the emergency department for the second time in the last 3 days due to abdominal pain associated with with numerous episodes of nausea, emesis, diarrhea, fatigue and malaise.  She has not been able to eat much because of vomiting and diarrhea.  Yesterday, she was able to eat a little bit of rice into grapes without vomiting, but did not try to eat anything else due to abdominal pain.  Denied constipation, melena or hematochezia.  Positive left flank pain, but no dysuria, frequency or hematuria.  No polyuria, polydipsia, polyphagia or blurred vision.  She denied fever, night sweats, chills, sore throat, rhinorrhea, wheezing or hemoptysis.  No chest pain, palpitations, diaphoresis, PND, orthopnea or pitting edema of the lower extremities.  ED course: Initial vital signs were temperature 98.1 F, pulse 76, respirations 20, BP 115/91 mmHg and O2 sat 100% on room air.  The patient received 4000 mL of NS bolus, ondansetron 4 mg IVP, hyoscyamine 125 mcg sublingually, Bentyl 20 mg IVP, famotidine 20 mg IVPB and fentanyl 50 mcg IVP x1.  Lab work: Urinalysis showed glucosuria more than 500 and ketonuria more than 80 mg/dL with a specific gravity of more than 1.0 46.  CBC with a white count of 4.4, hemoglobin 15.1 g/dL platelets 811.  Lipase was normal.  Sodium 129, potassium 3.6, chloride 104 and CO2 12 mmol/L.  Anion gap was 13.  Glucose 157 and calcium 7.9 mg/dL.  Her hepatic and renal functions were normal.  VBG showed a pH  of 7.23, PCO2 30 and PO2 68 mmHg.  Bicarbonate was 12.6 and acid-base deficit 13.7 mmol/L.  BHA was 3.14 mmol/L.  Lactic acid was normal.  BMP showed resell hyponatremia, but development of mild hypokalemia, which is likely dilutional given fluid boluses.  Imaging: CT abdomen/pelvis with contrast showed intraluminal fluid density within the distal colon which can be seen with active diarrheal illnesses.  There is mild sigmoid diverticulosis with acute diverticulitis.  There was aortic atherosclerosis.  Review of Systems: As mentioned in the history of present illness. All other systems reviewed and are negative. Past Medical History:  Diagnosis Date   Allergy    Anxiety    Asthma    Depression    Diabetes mellitus    Heart murmur    Hyperlipidemia    Hypertension    Morbid obesity (HCC)    Past Surgical History:  Procedure Laterality Date   EYE SURGERY     PREPATELLAR BURSA EXCISION     right   Social History:  reports that she has been smoking. She has quit using smokeless tobacco. She reports that she does not drink alcohol and does not use drugs.  Allergies  Allergen Reactions   Metformin And Related Diarrhea and Nausea And Vomiting    Pt requests that adverse reaction be reported for high doses of Metformin   Other     Walnuts - "itchy throat"   Oxycodone     Trouble breathing   Pioglitazone Diarrhea and Nausea And Vomiting    Family History  Adopted: Yes    Prior to Admission medications   Medication Sig Start Date End Date Taking? Authorizing Provider  atorvastatin (LIPITOR) 40 MG tablet Take 40 mg by mouth daily. 12/11/21  Yes [provider]  buPROPion (WELLBUTRIN XL) 300 MG 24 hr tablet TAKE 1 TABLET(300 MG) BY MOUTH DAILY 02/26/17  Yes Myrlene Broker, MD  citalopram (CELEXA) 40 MG tablet TAKE 1 TABLET(40 MG) BY MOUTH DAILY 02/26/17  Yes Myrlene Broker, MD  fluticasone [facility]) 50 MCG/ACT nasal spray Place 2 sprays into both nostrils  daily. Patient taking differently: Place 2 sprays into both nostrils daily as needed for allergies. 05/24/17  Yes Nche, Bonna Gains, NP  glipiZIDE (GLUCOTROL) 10 MG tablet Take 10 mg by mouth 2 (two) times daily. 12/11/21  Yes [provider]  guanFACINE (INTUNIV) 2 MG TB24 ER tablet Take 2 mg by mouth daily. 09/09/21  Yes [provider]  hydrOXYzine (VISTARIL) 25 MG capsule TAKE 2 CAPSULES(50 MG) BY MOUTH EVERY 6 HOURS AS NEEDED FOR ANXIETY Patient taking differently: 50 mg 2 (two) times daily. 05/14/17  Yes Myrlene Broker, MD  hyoscyamine (LEVSIN) 0.125 MG tablet Take 1 tablet (0.125 mg total) by mouth every 8 (eight) hours as needed for cramping. 01/14/22  Yes Petrucelli, Samantha R, PA-C  liraglutide (VICTOZA) 18 MG/3ML SOPN ADMINISTER 1.8 MG UNDER THE SKIN EVERY DAY 02/26/17  Yes Myrlene Broker, MD  lisinopril (ZESTRIL) 2.5 MG tablet Take 2.5 mg by mouth daily. 12/12/21  Yes [provider]  montelukast (SINGULAIR) 10 MG tablet Take 1 tablet (10 mg total) by mouth daily. Patient taking differently: Take 10 mg by mouth at bedtime. 04/15/16  Yes Myrlene Broker, MD  ondansetron (ZOFRAN) 4 MG tablet Take 1 tablet (4 mg total) by mouth every 6 (six) hours. 01/12/22  Yes Wynetta Fines, MD  pantoprazole (PROTONIX) 40 MG tablet Take 1 tablet (40 mg total) by mouth daily. 01/14/22  Yes Petrucelli, Samantha R, PA-C  promethazine (PHENERGAN) 25 MG tablet Take 1 tablet (25 mg total) by mouth every 8 (eight) hours as needed for nausea or vomiting. 01/14/22  Yes Petrucelli, Samantha R, PA-C  sodium chloride (OCEAN) 0.65 % SOLN nasal spray Place 1 spray into both nostrils as needed for congestion. 05/24/17  Yes Nche, Bonna Gains, NP  SYNJARDY 12.03-999 MG TABS Take 1 tablet by mouth 2 (two) times daily. 12/13/21  Yes [provider]  traZODone (DESYREL) 150 MG tablet Take 150 mg by mouth at bedtime. 05/14/17  Yes [provider]  VYVANSE 60 MG capsule Take 60  mg by mouth daily. 12/19/21  Yes [provider]  B-D ULTRAFINE III SHORT PEN 31G X 8 MM MISC See admin instructions. 03/11/17   [provider]  glucose blood (ACCU-CHEK AVIVA PLUS) test strip 1 each by Other route 2 (two) times daily. Use to check blood sugar twice a day. Dx E11.9 08/27/16   Myrlene Broker, MD  Insulin Pen Needle (SURE COMFORT PEN NEEDLES) 30G X 8 MM MISC Use for injections 08/04/16   Myrlene Broker, MD    Physical Exam: Vitals:   01/14/22 9563 01/14/22 0730 01/14/22 0815 01/14/22 1000  BP:  109/79 91/66 120/80  Pulse: 80 85 82 86  Resp:  18 17 18   Temp:      TempSrc:      SpO2: 100% 98% 98% 99%  Weight:      Height:       Physical Exam Constitutional:  Appearance: She is obese.  HENT:     Head: Normocephalic.     Mouth/Throat:     Mouth: Mucous membranes are dry.  Eyes:     General: No scleral icterus.    Pupils: Pupils are equal, round, and reactive to light.  Cardiovascular:     Rate and Rhythm: Normal rate and regular rhythm.  Pulmonary:     Effort: Pulmonary effort is normal.     Breath sounds: Normal breath sounds. No wheezing, rhonchi or rales.  Abdominal:     General: Abdomen is protuberant. Bowel sounds are normal. There is no distension or abdominal bruit.     Tenderness: There is generalized abdominal tenderness. There is left CVA tenderness. There is no guarding or rebound.  Musculoskeletal:     Cervical back: Neck supple.     Right lower leg: No edema.     Left lower leg: No edema.  Skin:    General: Skin is warm and dry.     Coloration: Skin is not jaundiced.  Neurological:     General: No focal deficit present.     Mental Status: She is alert and oriented to person, place, and time.  Psychiatric:        Mood and Affect: Mood normal.        Behavior: Behavior normal.   Data Reviewed:  There are no new results to review at this time.  Assessment and Plan: Principal Problem:   Type 2 diabetes  mellitus (HCC) Presenting with   Metabolic acidosis due to   Fasting Ketoacidosis Secondary to poor oral intake Dextrose 50% 25 g IVP x1. Insulin bolus deferred due to hypoglycemia. Begin insulin infusion at 1 unit/h. Monitor CBG hourly. Hold Synjardy, Victoza and Vyvanse. Follow-up BHA, VBG and BMP.  Active Problems:   Intractable nausea and vomiting No fever or leukocytosis. Seems to be improving. Continue IV fluids. Continue analgesics and antiemetics    Hypokalemia Secondary to vomiting/diarrhea/hemodilution. Continue supplementation with IV fluids. Follow-up potassium level.    Hyponatremia Secondary to GI losses. Resolved. Continue IV fluids.    Essential hypertension Continue lisinopril 2.5 mg p.o. daily. Monitor BP, renal function electrolytes.    Major depression, recurrent (HCC) Continue bupropion XL 300 mg p.o. daily. Continue escitalopram 40 mg p.o. daily. Continue hydroxyzine 50 mg p.o. twice daily. Continue trazodone 150 mg p.o. at bedtime. Follow-up with PCP and BH as an outpatient.    Class 3 obesity (HCC) Continue lifestyle modifications. Follow-up with PCP and endocrinology.     Advance Care Planning:   Code Status: Full code.   Consults:   Family Communication:   Severity of Illness: The appropriate patient status for this patient is OBSERVATION. Observation status is judged to be reasonable and necessary in order to provide the required intensity of service to ensure the patient's safety. The patient's presenting symptoms, physical exam findings, and initial radiographic and laboratory data in the context of their medical condition is felt to place them at decreased risk for further clinical deterioration. Furthermore, it is anticipated that the patient will be medically stable for discharge from the hospital within 2 midnights of admission.   Author: Bobette Moavid Manuel Geffrey Michaelsen, MD 01/14/2022 11:06 AM  For on call review [email or web address].   This  document was prepared using [employer] voice-recognition software and may contain some unintended transcription errors.

## 2022-01-15 ENCOUNTER — Encounter (HOSPITAL_COMMUNITY): Payer: Self-pay

## 2022-01-15 DIAGNOSIS — E1110 Type 2 diabetes mellitus with ketoacidosis without coma: Secondary | ICD-10-CM | POA: Diagnosis not present

## 2022-01-15 DIAGNOSIS — E876 Hypokalemia: Secondary | ICD-10-CM

## 2022-01-15 DIAGNOSIS — I10 Essential (primary) hypertension: Secondary | ICD-10-CM | POA: Diagnosis not present

## 2022-01-15 LAB — CBC
HCT: 37 % (ref 36.0–46.0)
Hemoglobin: 12.5 g/dL (ref 12.0–15.0)
MCH: 29.2 pg (ref 26.0–34.0)
MCHC: 33.8 g/dL (ref 30.0–36.0)
MCV: 86.4 fL (ref 80.0–100.0)
Platelets: 208 10*3/uL (ref 150–400)
RBC: 4.28 MIL/uL (ref 3.87–5.11)
RDW: 12.4 % (ref 11.5–15.5)
WBC: 4.2 10*3/uL (ref 4.0–10.5)
nRBC: 0 % (ref 0.0–0.2)

## 2022-01-15 LAB — COMPREHENSIVE METABOLIC PANEL
ALT: 32 U/L (ref 0–44)
AST: 18 U/L (ref 15–41)
Albumin: 2.7 g/dL — ABNORMAL LOW (ref 3.5–5.0)
Alkaline Phosphatase: 39 U/L (ref 38–126)
Anion gap: 2 — ABNORMAL LOW (ref 5–15)
BUN: <5 mg/dL — ABNORMAL LOW (ref 6–20)
CO2: 18 mmol/L — ABNORMAL LOW (ref 22–32)
Calcium: 7.7 mg/dL — ABNORMAL LOW (ref 8.9–10.3)
Chloride: 115 mmol/L — ABNORMAL HIGH (ref 98–111)
Creatinine, Ser: 0.41 mg/dL — ABNORMAL LOW (ref 0.44–1.00)
GFR, Estimated: >60 mL/min (ref >60)
Glucose, Bld: 161 mg/dL — ABNORMAL HIGH (ref 70–99)
Potassium: 3.6 mmol/L (ref 3.5–5.1)
Sodium: 135 mmol/L (ref 135–145)
Total Bilirubin: 0.4 mg/dL (ref 0.3–1.2)
Total Protein: 5.2 g/dL — ABNORMAL LOW (ref 6.5–8.1)

## 2022-01-15 LAB — GLUCOSE, CAPILLARY
Glucose-Capillary: 142 mg/dL — ABNORMAL HIGH (ref 70–99)
Glucose-Capillary: 142 mg/dL — ABNORMAL HIGH (ref 70–99)
Glucose-Capillary: 156 mg/dL — ABNORMAL HIGH (ref 70–99)
Glucose-Capillary: 158 mg/dL — ABNORMAL HIGH (ref 70–99)
Glucose-Capillary: 165 mg/dL — ABNORMAL HIGH (ref 70–99)
Glucose-Capillary: 169 mg/dL — ABNORMAL HIGH (ref 70–99)
Glucose-Capillary: 198 mg/dL — ABNORMAL HIGH (ref 70–99)
Glucose-Capillary: 206 mg/dL — ABNORMAL HIGH (ref 70–99)

## 2022-01-15 LAB — HIV ANTIBODY (ROUTINE TESTING W REFLEX): HIV Screen 4th Generation wRfx: NONREACTIVE

## 2022-01-15 MED ORDER — GLIPIZIDE 10 MG PO TABS
10.0000 mg | ORAL_TABLET | Freq: Two times a day (BID) | ORAL | Status: DC
  Administered 2022-01-15: 10:00:00 10 mg via ORAL
  Filled 2022-01-15: qty 1

## 2022-01-15 MED ORDER — PANTOPRAZOLE SODIUM 40 MG PO TBEC: 40.0000 mg | DELAYED_RELEASE_TABLET | Freq: Every day | ORAL | Status: DC

## 2022-01-15 NOTE — Progress Notes (Signed)
Inpatient Diabetes Program Recommendations ? ?AACE/ADA: New Consensus Statement on Inpatient Glycemic Control (2015) ? ?Target Ranges:  Prepandial:   less than 140 mg/dL ?     Peak postprandial:   less than 180 mg/dL (1-2 hours) ?     Critically ill patients:  140 - 180 mg/dL  ? ?Lab Results  ?Component Value Date  ? GLUCAP 169 (H) 01/15/2022  ? HGBA1C 9.2 (H) 02/26/2017  ? ? ?Review of Glycemic Control ? ?Diabetes history: DM2 ?Outpatient Diabetes medications: Glipizide 10 mg BID, Victoza 1.8 mg QD, Synjardy 12.03-999 mg BID ?Current orders for Inpatient glycemic control: Glipizide 10 mg BID ? ?Inpatient Diabetes Program Recommendations:   ? ?Might consider adding Metformin 1000 mg BID. ? ?Will continue to follow while inpatient. ? ?Thank you, ?Dulce Sellar, MSN, RN ?Diabetes Coordinator ?Inpatient Diabetes Program ?[phone] (team pager from 8a-5p) ? ? ? ?

## 2022-01-15 NOTE — Progress Notes (Signed)
AVS instructions reviewed with pt per orders. Education and patient's care plan completed with all questions answered by this RN. All patient belongings returned to patient. PIVs removed. Patient was taken to main entrance by this RN in wheelchair.  ?

## 2022-01-15 NOTE — Hospital Course (Signed)
Rita Hogan is a 36 y.o. F with obesity, DM, asthma, depression and HTN who presented for second time in 3 days with diffuse abdominal pain, nausea, emesis, diarrhea, and fatigue. ? ?In the ER, BHOB elevated, bicarb low, glucose normal, VBG showed acidosis.  CT abdomen and pelvis normal.  Started on fluids, insulin drip, anti-emetics and admitted for euglycemic DKA and intractable nausea. ?

## 2022-01-15 NOTE — Progress Notes (Signed)
Insulin gtt and associated IVF stopped per Dr Sheryn Bison orders. No long acting insulin ordered d/t patient's oral DM medications. This RN will continue to carefully monitor patient's CBGs.  ?

## 2022-01-15 NOTE — Discharge Summary (Signed)
Physician Discharge Summary   Patient: Rita Hogan MRN: [id number] DOB: Oct 28, 1986  Admit date:     01/13/2022  Discharge date: 01/15/22  Discharge Physician: Alberteen Sam   PCP: Judd Lien, PA-C   Recommendations at discharge:  Follow up with PCP Irving Copas in 1 week Rita Hogan: Given DKA risk, patient was given guidance here to hold EMPAGLIFLOZIN if unable to tolerate PO intake, if vomiting, or otherwise volume depleted/dehydrated - Please reinforce with patient       Discharge Diagnoses: Principal Problem:   Diabetic ketoacidosis without coma associated with type 2 diabetes mellitus (HCC) due to empagliflozin Active Problems:   Intractable nausea and vomiting due to DKA   Controlled type 2 diabetes mellitus with ketoacidosis (HCC)   Essential hypertension   Major depression, recurrent (HCC)   Hypokalemia   Class 3 obesity (HCC)   Hyponatremia       Hospital Course: Rita Hogan is a 36 y.o. F with obesity, DM, asthma, depression and HTN who presented for second time in 3 days with diffuse abdominal pain, nausea, emesis, diarrhea, and fatigue.  In the ER, BHOB elevated, bicarb low, glucose normal, VBG showed acidosis.  CT abdomen and pelvis normal.  Started on fluids, insulin drip, anti-emetics and admitted for euglycemic DKA in the setting of SGLT2 inhibitor use.  Treated with insulin infusion overnight. BHOB resolved.  In morning, symptoms were improved, she was able to tolerate PO well, and was discharged on home glipizide.  May resume SGLT2 inhibitor in 1 week if symptoms remain rseolved and PO intake good.             Pain control - [employer] Controlled Substance Reporting System database was reviewed.    Consultants: Diabetes Education Disposition: Home Diet recommendation: Diabetic  DISCHARGE MEDICATION: Allergies as of 01/15/2022       Reactions   Metformin And Related Diarrhea, Nausea And Vomiting   Pt requests that adverse  reaction be reported for high doses of Metformin   Other    Walnuts - "itchy throat"   Oxycodone    Trouble breathing   Pioglitazone Diarrhea, Nausea And Vomiting        Medication List     STOP taking these medications    cetirizine 10 MG tablet Commonly known as: ZYRTEC   fluconazole 150 MG tablet Commonly known as: DIFLUCAN   liraglutide 18 MG/3ML Sopn Commonly known as: Victoza   Synjardy 12.03-999 MG Tabs Generic drug: Empagliflozin-metFORMIN HCl       TAKE these medications    atorvastatin 40 MG tablet Commonly known as: LIPITOR Take 40 mg by mouth daily.   buPROPion 300 MG 24 hr tablet Commonly known as: WELLBUTRIN XL TAKE 1 TABLET(300 MG) BY MOUTH DAILY   citalopram 40 MG tablet Commonly known as: CELEXA TAKE 1 TABLET(40 MG) BY MOUTH DAILY   fluticasone 50 MCG/ACT nasal spray Commonly known as: FLONASE Place 2 sprays into both nostrils daily. What changed:  when to take this reasons to take this   glipiZIDE 10 MG tablet Commonly known as: GLUCOTROL Take 10 mg by mouth 2 (two) times daily.   glucose blood test strip Commonly known as: Accu-Chek Aviva Plus 1 each by Other route 2 (two) times daily. Use to check blood sugar twice a day. Dx E11.9   guanFACINE 2 MG Tb24 ER tablet Commonly known as: INTUNIV Take 2 mg by mouth daily.   hydrOXYzine 25 MG capsule Commonly known as: VISTARIL TAKE 2 CAPSULES(50  MG) BY MOUTH EVERY 6 HOURS AS NEEDED FOR ANXIETY What changed: See the new instructions.   hyoscyamine 0.125 MG tablet Commonly known as: LEVSIN Take 1 tablet (0.125 mg total) by mouth every 8 (eight) hours as needed for cramping.   Insulin Pen Needle 30G X 8 MM Misc Commonly known as: Sure Comfort Pen Needles Use for injections   B-D ULTRAFINE III SHORT PEN 31G X 8 MM Misc Generic drug: Insulin Pen Needle See admin instructions.   lisinopril 2.5 MG tablet Commonly known as: ZESTRIL Take 2.5 mg by mouth daily.   montelukast 10 MG  tablet Commonly known as: SINGULAIR Take 1 tablet (10 mg total) by mouth daily. What changed: when to take this   ondansetron 4 MG tablet Commonly known as: ZOFRAN Take 1 tablet (4 mg total) by mouth every 6 (six) hours.   pantoprazole 40 MG tablet Commonly known as: Protonix Take 1 tablet (40 mg total) by mouth daily.   promethazine 25 MG tablet Commonly known as: PHENERGAN Take 1 tablet (25 mg total) by mouth every 8 (eight) hours as needed for nausea or vomiting.   sodium chloride 0.65 % Soln nasal spray Commonly known as: OCEAN Place 1 spray into both nostrils as needed for congestion.   traZODone 150 MG tablet Commonly known as: DESYREL Take 150 mg by mouth at bedtime.   Vyvanse 60 MG capsule Generic drug: lisdexamfetamine Take 60 mg by mouth daily.        Follow-up Information     Irving Copas H, PA-C. Schedule an appointment as soon as possible for a visit in 1 week(s).   Specialty: Physician Assistant Contact information: [phone] [place] [address] [phone]                Discharge Instructions     Discharge instructions   Complete by: As directed    You were admitted for abdominal symptoms which turned out to be DKA. You had a CT scan of the abdomen, which showed no significant findings.  It was consistent with a colitis (which is in the same family as a "stomach flu", meaning a viral gastroenteritis, common this time of year).  You were found though to be in "euglycemic DKA" a form of diabetic ketoacidosis that can happen with empagliflozin and medicines (like Synjardy) that contain it  STOP the Synjardy for now Go see your primary care in 2 weeks as planned.  Also stop the Victoza for now   If, over the next week, your abdominal cramps, diarrhea and nausea completely go away, you can restart the Victoza. If you take Victoza for a few days and your symptoms are still completely gone, you may start Synjardy again,  but discuss this with your PCP  In the future, if you are ill and having nausea, vomiting or a lot of diarrhea, you should STOP Synjardy for a few days   Increase activity slowly   Complete by: As directed        Discharge Exam: Filed Weights   01/14/22 0025 01/14/22 1106  Weight: 103.4 kg 106.6 kg   General: Pt is alert, awake, not in acute distress Cardiovascular: RRR, nl S1-S2, no murmurs appreciated.   No LE edema.   Respiratory: Normal respiratory rate and rhythm.  CTAB without rales or wheezes. Abdominal: Abdomen soft and non-tender.  No distension or HSM.   Neuro/Psych: Strength symmetric in upper and lower extremities.  Judgment and insight appear normal.  Condition at discharge: good  The results of significant diagnostics from this hospitalization (including imaging, microbiology, ancillary and laboratory) are listed below for reference.   Imaging Studies: CT Abdomen Pelvis W Contrast  Result Date: 01/14/2022 CLINICAL DATA:  Initial evaluation for acute nonlocalized abdominal pain. EXAM: CT ABDOMEN AND PELVIS WITH CONTRAST TECHNIQUE: Multidetector CT imaging of the abdomen and pelvis was performed using the standard protocol following bolus administration of intravenous contrast. RADIATION DOSE REDUCTION: This exam was performed according to the departmental dose-optimization program which includes automated exposure control, adjustment of the mA and/or kV according to patient size and/or use of iterative reconstruction technique. CONTRAST:  OMNIPAQUE IOHEXOL 300 MG/ML  SOLN COMPARISON:  Prior ultrasound from 10/16/2010 and CT from 12/12/2021. FINDINGS: Lower chest: Visualized lung bases are clear. Hepatobiliary: Liver demonstrates a normal contrast enhanced appearance. Gallbladder within normal limits. No biliary dilatation. Pancreas: Unremarkable. No pancreatic ductal dilatation or surrounding inflammatory changes. Spleen: Normal in size without focal abnormality.  Adrenals/Urinary Tract: Adrenal glands within normal limits. Kidneys equal in size with symmetric enhancement. No nephrolithiasis, hydronephrosis, focal renal mass. No hydroureter or visible ureterolithiasis. Partially distended bladder within normal limits. Stomach/Bowel: Stomach within normal limits. No evidence for bowel obstruction. Appendix within normal limits. Mild sigmoid diverticulosis without evidence for acute diverticulitis. Intraluminal fluid density noted within the distal colon, which can be seen with active diarrheal illness. Mid no other acute inflammatory changes seen about the bowels. Vascular/Lymphatic: Mild aorto bi-iliac atherosclerotic disease. No aneurysm. Mesenteric vessels patent proximally. No adenopathy. Reproductive: Uterus and ovaries within normal limits for age. Probable small follicle noted within the right ovary. Other: No free air or fluid. Musculoskeletal: External soft tissues demonstrate no acute finding. No acute osseous finding. No worrisome osseous lesions. Benign bone island noted within the right iliac wing. IMPRESSION: 1. Intraluminal fluid density within the distal colon, which can be seen with active diarrheal illness. 2. Mild sigmoid diverticulosis without evidence for acute diverticulitis. 3. No other acute intra-abdominal or pelvic process. Aortic Atherosclerosis (ICD10-I70.0). Electronically Signed   By: Rise Mu M.D.   On: 01/14/2022 03:57    Microbiology: Results for orders placed or performed during the hospital encounter of 01/13/22  Resp Panel by RT-PCR (Flu A&B, Covid) Nasopharyngeal Swab     Status: None   Collection Time: 01/14/22  1:01 AM   Specimen: Nasopharyngeal Swab; Nasopharyngeal(NP) swabs in vial transport medium  Result Value Ref Range Status   SARS Coronavirus 2 by RT PCR NEGATIVE NEGATIVE Final    Comment: (NOTE) SARS-CoV-2 target nucleic acids are NOT DETECTED.  The SARS-CoV-2 RNA is generally detectable in upper  respiratory specimens during the acute phase of infection. The lowest concentration of SARS-CoV-2 viral copies this assay can detect is 138 copies/mL. A negative result does not preclude SARS-Cov-2 infection and should not be used as the sole basis for treatment or other patient management decisions. A negative result may occur with  improper specimen collection/handling, submission of specimen other than nasopharyngeal swab, presence of viral mutation(s) within the areas targeted by this assay, and inadequate number of viral copies(<138 copies/mL). A negative result must be combined with clinical observations, patient history, and epidemiological information. The expected result is Negative.  Fact Sheet for Patients:  BloggerCourse.com  Fact Sheet for Healthcare Providers:  SeriousBroker.it  This test is no t yet approved or cleared by the [place] FDA and  has been authorized for detection and/or diagnosis of SARS-CoV-2 by FDA under an Emergency Use Authorization (EUA).  This EUA will remain  in effect (meaning this test can be used) for the duration of the COVID-19 declaration under Section 564(b)(1) of the Act, 21 U.S.C.section 360bbb-3(b)(1), unless the authorization is terminated  or revoked sooner.       Influenza A by PCR NEGATIVE NEGATIVE Final   Influenza B by PCR NEGATIVE NEGATIVE Final    Comment: (NOTE) The Xpert Xpress SARS-CoV-2/FLU/RSV plus assay is intended as an aid in the diagnosis of influenza from Nasopharyngeal swab specimens and should not be used as a sole basis for treatment. Nasal washings and aspirates are unacceptable for Xpert Xpress SARS-CoV-2/FLU/RSV testing.  Fact Sheet for Patients: BloggerCourse.comhttps://www.fda.gov/media/152166/download  Fact Sheet for Healthcare Providers: SeriousBroker.ithttps://www.fda.gov/media/152162/download  This test is not yet approved or cleared by the [place] FDA and has been  authorized for detection and/or diagnosis of SARS-CoV-2 by FDA under an Emergency Use Authorization (EUA). This EUA will remain in effect (meaning this test can be used) for the duration of the COVID-19 declaration under Section 564(b)(1) of the Act, 21 U.S.C. section 360bbb-3(b)(1), unless the authorization is terminated or revoked.  Performed at [facility], [address] [place], [place] [address]   MRSA Next Gen by PCR, Nasal     Status: None   Collection Time: 01/14/22 11:17 AM   Specimen: Nasal Mucosa; Nasal Swab  Result Value Ref Range Status   MRSA by PCR Next Gen NOT DETECTED NOT DETECTED Final    Comment: (NOTE) The GeneXpert MRSA Assay (FDA approved for NASAL specimens only), is one component of a comprehensive MRSA colonization surveillance program. It is not intended to diagnose MRSA infection nor to guide or monitor treatment for MRSA infections. Test performance is not FDA approved in patients less than 35 years old. Performed at [facility], [address] [place], [place] [address]     Labs: CBC: Recent Labs  Lab 01/12/22 0719 01/14/22 0130 01/15/22 0245  WBC 14.6* 4.4 4.2  NEUTROABS 13.7* 3.4  --   HGB 17.6* 15.1* 12.5  HCT 50.9* 48.1* 37.0  MCV 84.7 93.8 86.4  PLT 344 205 208   Basic Metabolic Panel: Recent Labs  Lab 01/14/22 0130 01/14/22 0632 01/14/22 0803 01/14/22 1426 01/14/22 1916 01/15/22 0245  NA 129* 136  --  136 134* 135  K 3.6 3.3*  --  3.5 3.8 3.6  CL 104 115*  --  114* 114* 115*  CO2 12* 13*  --  18* 15* 18*  GLUCOSE 157* 85  --  131* 122* 161*  BUN 11 7  --  6 <5* <5*  CREATININE 0.56 0.45  --  0.42* 0.40* 0.41*  CALCIUM 7.9* 6.8*  --  7.6* 7.4* 7.7*  MG  --   --  1.8  --   --   --    Liver Function Tests: Recent Labs  Lab 01/12/22 0719 01/14/22 0130 01/15/22 0245  AST 29 26 18   ALT 62* 44 32  ALKPHOS 81 51 39  BILITOT 1.1 0.9 0.4  PROT 8.4* 6.6 5.2*  ALBUMIN 5.0 3.5 2.7*    CBG: Recent Labs  Lab 01/15/22 0405 01/15/22 0559 01/15/22 0715 01/15/22 0939 01/15/22 1127  GLUCAP 142* 142* 169* 198* 206*    Discharge time spent: 40 minutes.  Signed: Alberteen Samhristopher P Nzinga Ferran, MD [facility] 01/15/2022

## 2022-01-15 NOTE — Progress Notes (Signed)
To whom it may concern, ?Ms. Rita Hogan has been receiving care at [facility] from 01/13/22-01/15/22. Please excuse her absence for these days, as she has been recovering from illness.  ?Sincerely, ?Twala Collings Pearla Dubonnet, RN   ?  ? ?[facility] ?[address] [place] ?[place], [place]  [address] ?Phone:  [phone]   Fax:  [phone] ?  ? ?

## 2022-01-15 NOTE — TOC Initial Note (Signed)
Transition of Care (TOC) - Initial/Assessment Note  ? ? ?Patient Details  ?Name: Rita Hogan ?MRN: [id number] ?Date of Birth: 1986-10-30 ? ?Transition of Care (TOC) CM/SW Contact:    ?Cecille Po, RN ?Phone Number: ?01/15/2022, 9:25 AM ? ?Clinical Narrative:                 ? ? ?Transition of Care (TOC) Screening Note ? ? ?Patient Details  ?Name: Rita Hogan ?Date of Birth: 10-14-1986 ? ? ?Transition of Care (TOC) CM/SW Contact:    ?Cecille Po, RN ?Phone Number: ?01/15/2022, 9:26 AM ? ? ? ?Transition of Care Department [facility]) has reviewed patient and no TOC needs have been identified at this time. We will continue to monitor patient advancement through interdisciplinary progression rounds. If new patient transition needs arise, please place a TOC consult. ? ? ? ?Expected Discharge Plan: Home/Self Care ?Barriers to Discharge: Continued Medical Work up ? ? ? ? ?Expected Discharge Plan and Services ?Expected Discharge Plan: Home/Self Care ?  ?  ? ?Prior Living Arrangements/Services ?  ?  ?Patient language and need for interpreter reviewed:: Yes ?Do you feel safe going back to the place where you live?: Yes      ?Need for Family Participation in Patient Care: Yes (Comment) ?Care giver support system in place?: Yes (comment) ?  ?Criminal Activity/Legal Involvement Pertinent to Current Situation/Hospitalization: No - Comment as needed ? ? ?  ?Orientation: : Oriented to Self, Oriented to Place, Oriented to  Time, Oriented to Situation ?  ?Psych Involvement: No (comment) ? ?Admission diagnosis:  Dehydration [E86.0] ?Ketoacidosis [E87.29] ?Generalized abdominal pain [R10.84] ?Nausea vomiting and diarrhea [R11.2, R19.7] ?Intractable nausea and vomiting [R11.2] ?Patient Active Problem List  ? Diagnosis Date Noted  ? Intractable nausea and vomiting 01/14/2022  ? Diabetic ketoacidosis without coma associated with type 2 diabetes mellitus (HCC) 01/14/2022  ? Class 3 obesity (HCC) 01/14/2022  ? Hyponatremia  01/14/2022  ? Trigger ring finger of left hand 05/25/2017  ? Raynaud phenomenon 05/25/2017  ? Carpal tunnel syndrome on both sides 05/25/2017  ? Gastroenteritis 04/11/2017  ? Hypokalemia 04/11/2017  ? Cat bite 02/20/2017  ? Bite wound of right hand 02/20/2017  ? Major depression, recurrent (HCC) 07/21/2016  ? Essential hypertension 12/24/2015  ? Morbid obesity (HCC) 12/24/2015  ? Hidradenitis suppurativa 12/24/2015  ? Controlled type 2 diabetes mellitus with ketoacidosis (HCC) 07/09/2011  ? ?PCP:  Judd Lien, PA-C ?Pharmacy:   ?[id number] [place], [place] - [address] AT [facility] [address] ?[address] ?[place] [address] ?Phone: [phone] Fax: [phone] ? ?[facility] [place] - [address] ?[address] ?[place] [address] ?Phone: [phone] Fax: [phone] ? ? ? ?Readmission Risk Interventions ?No flowsheet data found. ? ? ?

## 2022-01-15 NOTE — Progress Notes (Signed)
To whom it may concern, ?Ms. Rita Hogan has been receiving care at [facility] from 01/13/22-01/15/22. Please excuse her absence for these days, as she has been recovering from illness.  ?Sincerely, ?Evanell Redlich N Cochran, RN   ?  ? ? [facility] ?[address] ?[place], [place]  [address] ?Phone:  [phone]   Fax:  [phone] ?  ? ?

## 2022-07-21 ENCOUNTER — Other Ambulatory Visit (HOSPITAL_COMMUNITY): Payer: Self-pay

## 2022-07-21 MED ORDER — LISDEXAMFETAMINE DIMESYLATE 60 MG PO CAPS: ORAL_CAPSULE | ORAL | 0 refills | Status: AC

## 2022-08-26 IMAGING — CT CT ABD-PELV W/ CM
2 of 4 series · 16 of 46 positions shown, 18 images · IV contrast (OMNIPAQUE 300)
Comparison: Prior ultrasound from 10/16/2010 and CT from
12/12/2021.

CLINICAL DATA: Initial evaluation for acute nonlocalized abdominal
pain.

EXAM:
CT ABDOMEN AND PELVIS WITH CONTRAST
TECHNIQUE: Multidetector CT imaging of the abdomen and pelvis was performed
using the standard protocol following bolus administration of
intravenous contrast.

[Series 3: axial st · axial · 0.85mm/px · z∈[+971,+1386]mm · 13 of 95 slices shown, 15 images]
[im 6/95  soft-tissue]
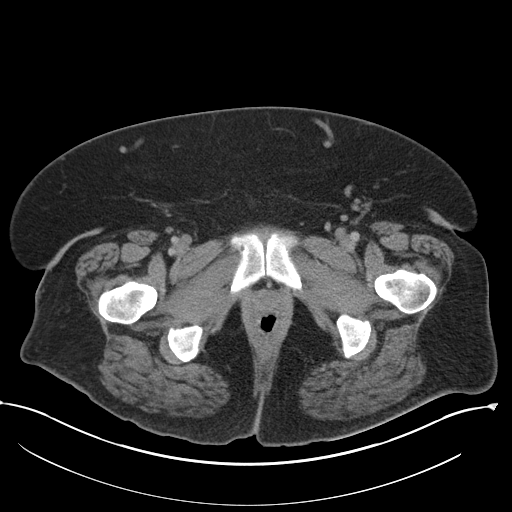
[im 6/95  bone]
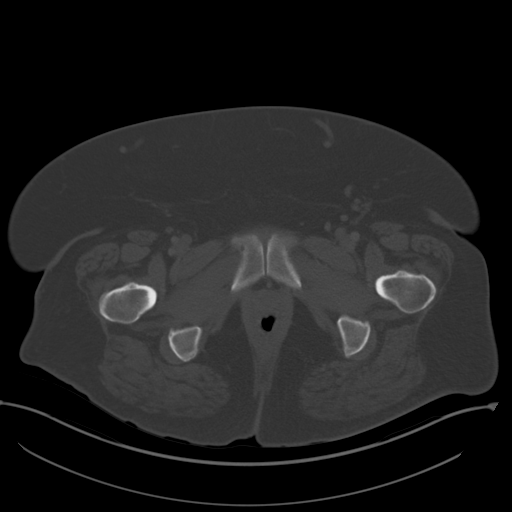
[im 11/95  soft-tissue]
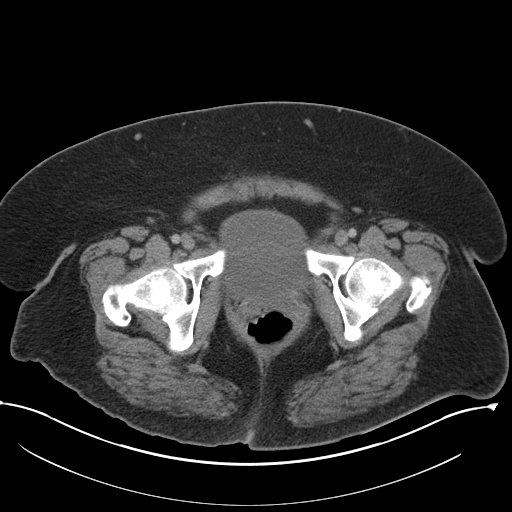
[im 21/95  soft-tissue]
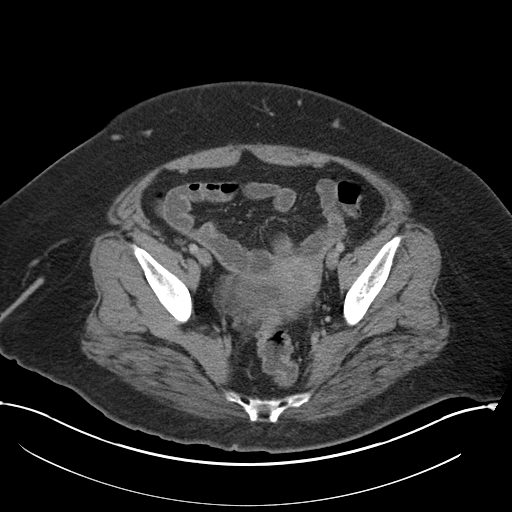
[im 27/95  soft-tissue]
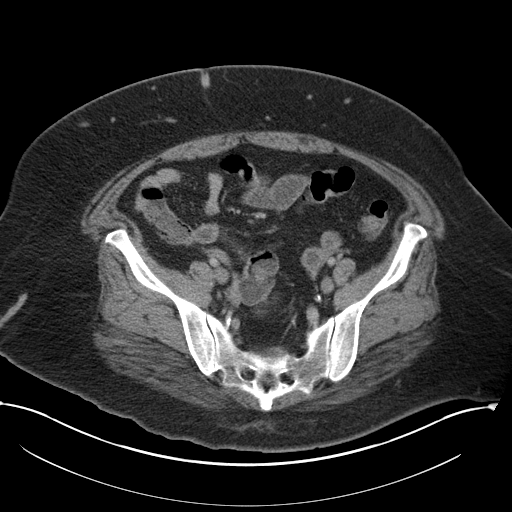
[im 32/95  soft-tissue]
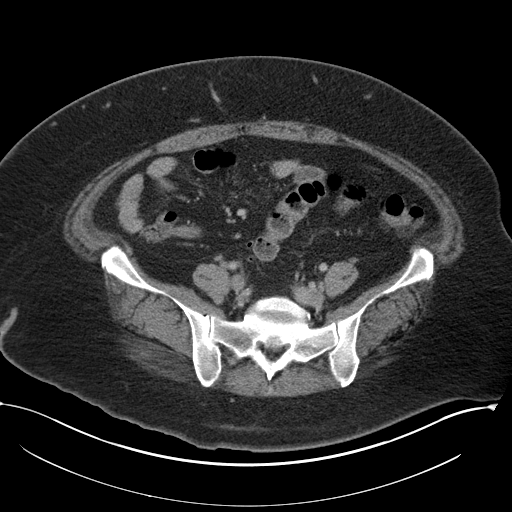
[im 42/95  soft-tissue]
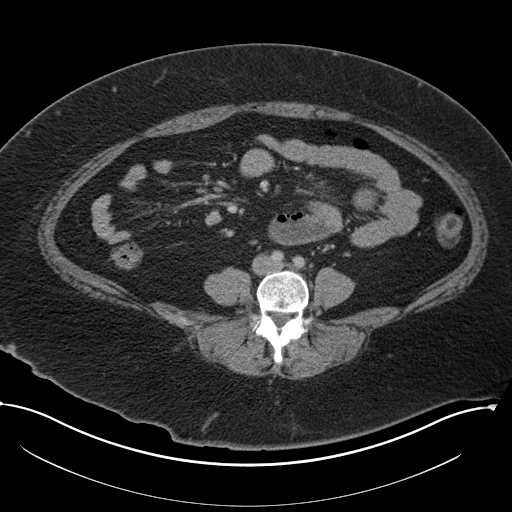
[im 48/95  soft-tissue]
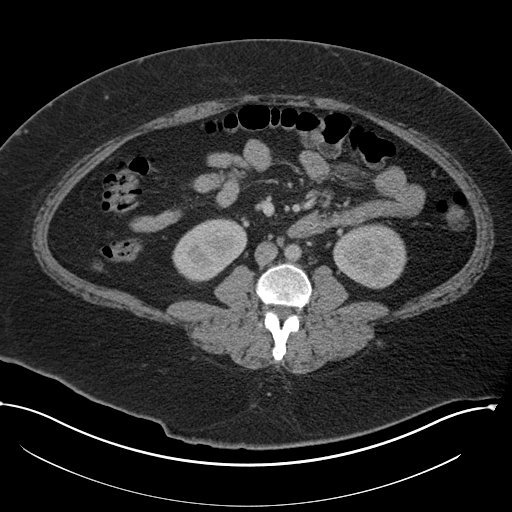
[im 53/95  soft-tissue]
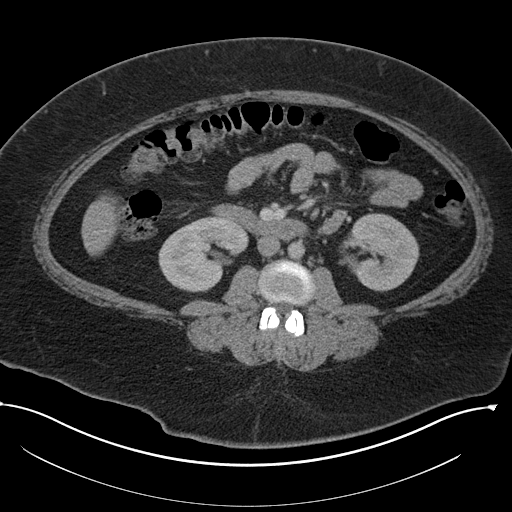
[im 63/95  soft-tissue]
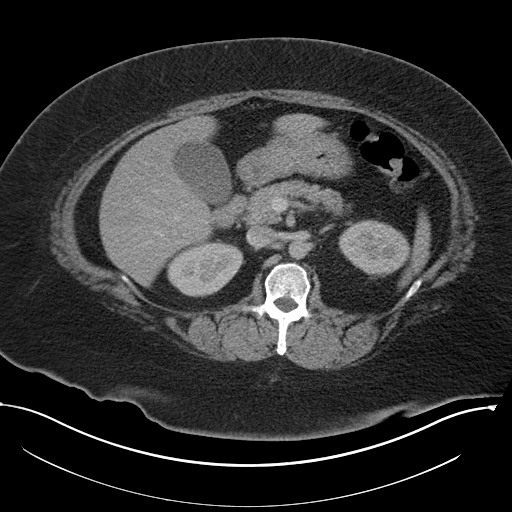
[im 63/95  bone]
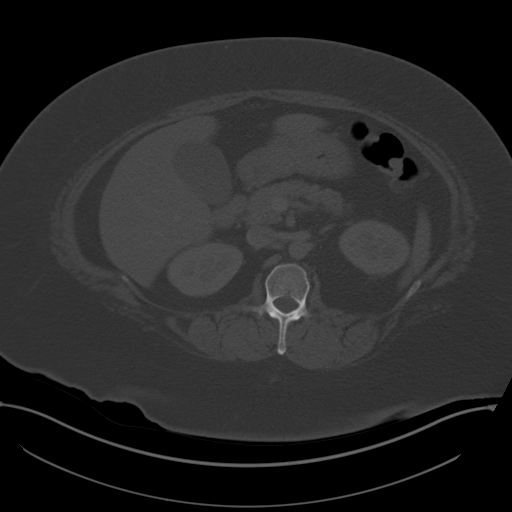
[im 68/95  soft-tissue]
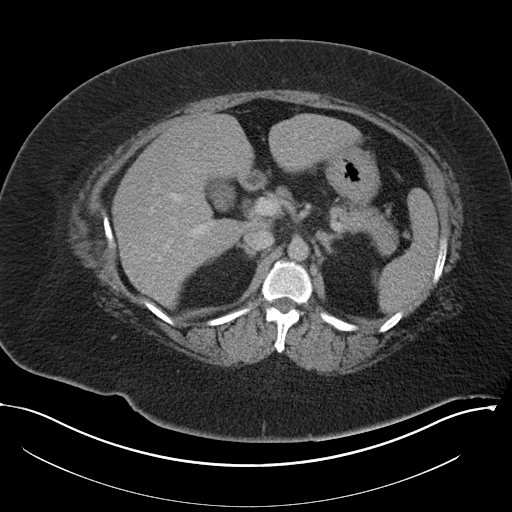
[im 74/95  soft-tissue]
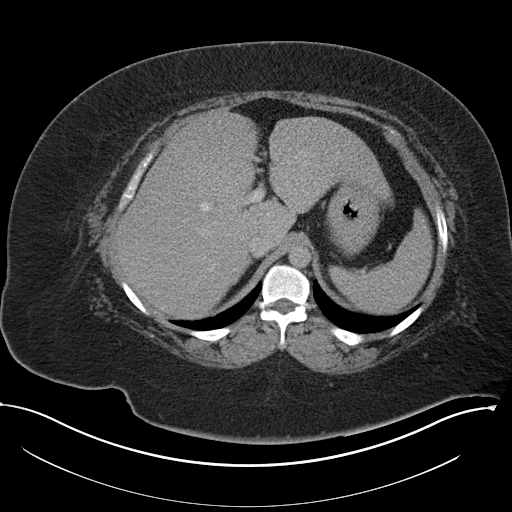
[im 84/95  soft-tissue]
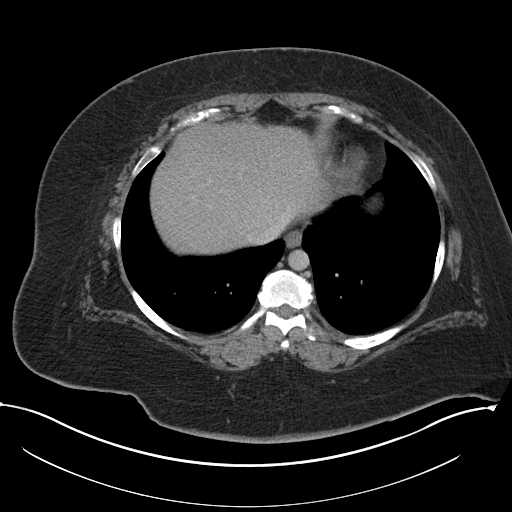
[im 89/95  soft-tissue]
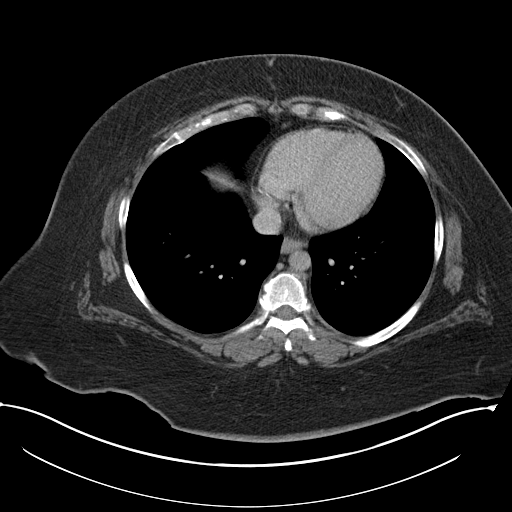

[Series 4: coronal st · coronal · 0.90mm/px · 3 of 161 slices shown]
[im 54/161  soft-tissue]
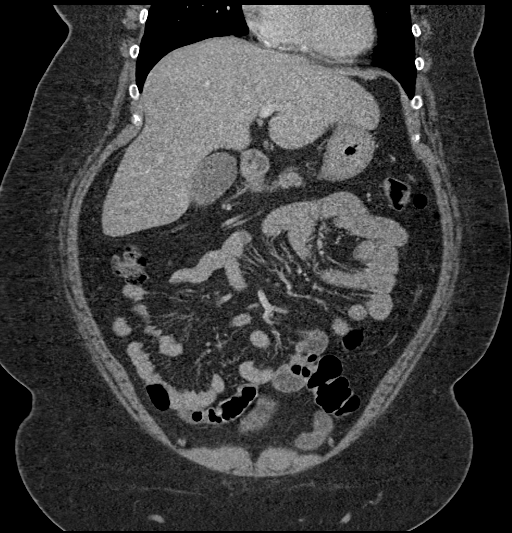
[im 72/161  soft-tissue]
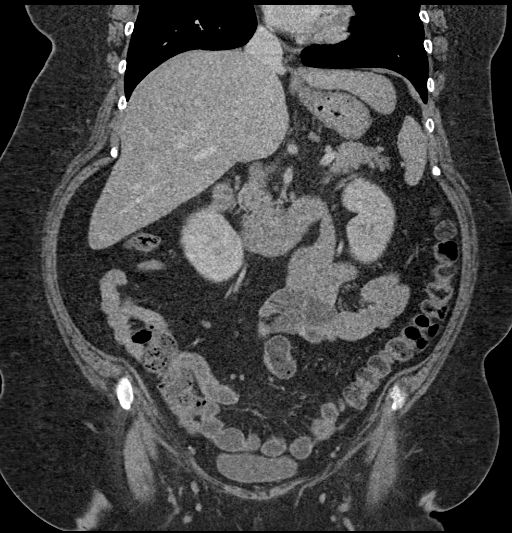
[im 89/161  soft-tissue]
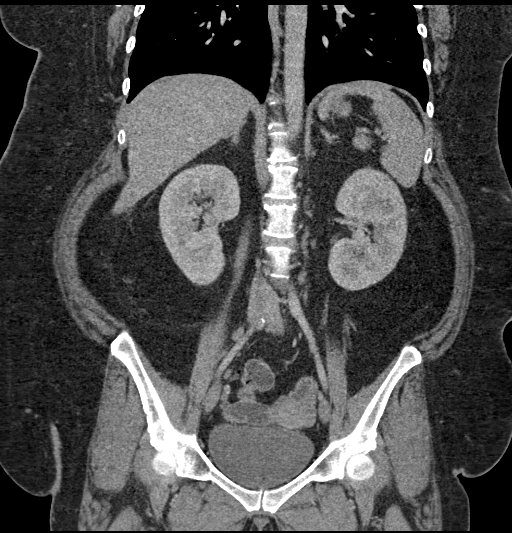

[16 of 46 positions shown; findings below may reference images not displayed]

RADIATION DOSE REDUCTION: This exam was performed according to the
departmental dose-optimization program which includes automated
exposure control, adjustment of the mA and/or kV according to
patient size and/or use of iterative reconstruction technique.

CONTRAST:  100mL OMNIPAQUE IOHEXOL 300 MG/ML  SOLN
FINDINGS: Lower chest: Visualized lung bases are clear.

Hepatobiliary: Liver demonstrates a normal contrast enhanced
appearance. Gallbladder within normal limits. No biliary dilatation.

Pancreas: Unremarkable. No pancreatic ductal dilatation or
surrounding inflammatory changes.

Spleen: Normal in size without focal abnormality.

Adrenals/Urinary Tract: Adrenal glands within normal limits. Kidneys
equal in size with symmetric enhancement. No nephrolithiasis,
hydronephrosis, focal renal mass. No hydroureter or visible
ureterolithiasis. Partially distended bladder within normal limits.

Stomach/Bowel: Stomach within normal limits. No evidence for bowel
obstruction. Appendix within normal limits. Mild sigmoid
diverticulosis without evidence for acute diverticulitis.
Intraluminal fluid density noted within the distal colon, which can
be seen with active diarrheal illness. Mid no other acute
inflammatory changes seen about the bowels.

Vascular/Lymphatic: Mild aorto bi-iliac atherosclerotic disease. No
aneurysm. Mesenteric vessels patent proximally. No adenopathy.

Reproductive: Uterus and ovaries within normal limits for age.
Probable small follicle noted within the right ovary.

Other: No free air or fluid.

Musculoskeletal: External soft tissues demonstrate no acute finding.
No acute osseous finding. No worrisome osseous lesions. Benign bone
island noted within the right iliac wing.
IMPRESSION: 1. Intraluminal fluid density within the distal colon, which can be
seen with active diarrheal illness.
2. Mild sigmoid diverticulosis without evidence for acute
diverticulitis.
3. No other acute intra-abdominal or pelvic process.

Aortic Atherosclerosis ([id number]).

## 2023-09-21 ENCOUNTER — Encounter (HOSPITAL_COMMUNITY): Payer: Self-pay | Admitting: Emergency Medicine

## 2023-09-21 ENCOUNTER — Other Ambulatory Visit: Payer: Self-pay

## 2023-09-21 ENCOUNTER — Emergency Department (HOSPITAL_COMMUNITY): Payer: MEDICAID

## 2023-09-21 ENCOUNTER — Emergency Department (HOSPITAL_COMMUNITY)
Admission: EM | Admit: 2023-09-21 | Discharge: 2023-09-21 | Disposition: A | Discharge status: Home / Self Care | Payer: MEDICAID | Attending: Emergency Medicine | Admitting: Emergency Medicine

## 2023-09-21 DIAGNOSIS — Z794 Long term (current) use of insulin: Secondary | ICD-10-CM | POA: Insufficient documentation

## 2023-09-21 DIAGNOSIS — R55 Syncope and collapse: Secondary | ICD-10-CM | POA: Insufficient documentation

## 2023-09-21 DIAGNOSIS — Z7984 Long term (current) use of oral hypoglycemic drugs: Secondary | ICD-10-CM | POA: Diagnosis not present

## 2023-09-21 DIAGNOSIS — M25552 Pain in left hip: Secondary | ICD-10-CM | POA: Diagnosis not present

## 2023-09-21 DIAGNOSIS — E119 Type 2 diabetes mellitus without complications: Secondary | ICD-10-CM | POA: Diagnosis not present

## 2023-09-21 DIAGNOSIS — I10 Essential (primary) hypertension: Secondary | ICD-10-CM | POA: Diagnosis not present

## 2023-09-21 LAB — COMPREHENSIVE METABOLIC PANEL
ALT: 25 U/L (ref 0–44)
AST: 17 U/L (ref 15–41)
Albumin: 4.6 g/dL (ref 3.5–5.0)
Alkaline Phosphatase: 66 U/L (ref 38–126)
Anion gap: 11 (ref 5–15)
BUN: 18 mg/dL (ref 6–20)
CO2: 20 mmol/L — ABNORMAL LOW (ref 22–32)
Calcium: 9.6 mg/dL (ref 8.9–10.3)
Chloride: 102 mmol/L (ref 98–111)
Creatinine, Ser: 0.72 mg/dL (ref 0.44–1.00)
GFR, Estimated: >60 mL/min (ref >60)
Glucose, Bld: 258 mg/dL — ABNORMAL HIGH (ref 70–99)
Potassium: 3.5 mmol/L (ref 3.5–5.1)
Sodium: 133 mmol/L — ABNORMAL LOW (ref 135–145)
Total Bilirubin: 0.9 mg/dL (ref <1.2)
Total Protein: 7.9 g/dL (ref 6.5–8.1)

## 2023-09-21 LAB — CBC
HCT: 40.9 % (ref 36.0–46.0)
Hemoglobin: 13.7 g/dL (ref 12.0–15.0)
MCH: 30.2 pg (ref 26.0–34.0)
MCHC: 33.5 g/dL (ref 30.0–36.0)
MCV: 90.1 fL (ref 80.0–100.0)
Platelets: 317 10*3/uL (ref 150–400)
RBC: 4.54 MIL/uL (ref 3.87–5.11)
RDW: 12.4 % (ref 11.5–15.5)
WBC: 17.1 10*3/uL — ABNORMAL HIGH (ref 4.0–10.5)
nRBC: 0 % (ref 0.0–0.2)

## 2023-09-21 LAB — PREGNANCY, URINE: Preg Test, Ur: NEGATIVE

## 2023-09-21 MED ORDER — MELOXICAM 15 MG PO TABS: 15.0000 mg | ORAL_TABLET | Freq: Every day | ORAL | 0 refills | Status: AC

## 2023-09-21 MED ORDER — KETOROLAC TROMETHAMINE 15 MG/ML IJ SOLN
15.0000 mg | Freq: Once | INTRAMUSCULAR | Status: AC
Start: 2023-09-21 — End: 2023-09-21
  Administered 2023-09-21: 15 mg via INTRAVENOUS
  Filled 2023-09-21: qty 1

## 2023-09-21 MED ORDER — CYCLOBENZAPRINE HCL 10 MG PO TABS: 10.0000 mg | ORAL_TABLET | Freq: Two times a day (BID) | ORAL | 0 refills | Status: AC | PRN

## 2023-09-21 NOTE — ED Notes (Signed)
Helped pt to bathroom to void with wheelchair.  Pt is able to transfer independently

## 2023-09-21 NOTE — ED Notes (Signed)
Pt is in radiology.

## 2023-09-21 NOTE — ED Provider Notes (Signed)
[place] EMERGENCY DEPARTMENT AT [facility] Provider Note   CSN: [id number] Arrival date & time: 09/21/23  0308     History {Add pertinent medical, surgical, social history, OB history to HPI:1} No chief complaint on file.   Rita Hogan is a 37 y.o. female.  HPI   37 year old female presents emergency department with complaints of syncopal episode.  Patient states that symptoms initially began when she was at work.  States that she turned abruptly while at work when her name was called and noticed sharp left-sided hip pain during the event.  States that she took a Motrin not long after symptoms began but has had pain persistent since then with any movement of her left hip.  States that when the pain initially began, felt flushed, hot and lightheaded.  When she got home from work, was doing stretching exercises when she was sitting on the floor.  States that she subsequently stood up to move the couch and felt lightheaded and saw "spots in my vision."  She states that she sat there for a number of seconds before symptoms seem to resolve.  States that she then went to the kitchen where again, felt lightheaded as if she were going to pass out ringing in the ears and subsequently collapsed to the ground.  States that she lost consciousness for an unknown amount of time.  Denies any pain from fall.  Denies any chest pain during these episodes.  States that she did have symptoms like this when she is on too much blood pressure medications and it felt similar like her blood pressure was dropping.  Denies any visual disturbance, gait abnormality, slurred speech, facial droop, weakness/sensory deficits of lower extremities.  Denies any tongue biting, bowel/bladder dysfunction, history of seizure.  Patient is she feels at baseline currently.  Past medical history significant for diabetes mellitus, hypertension, hyperlipidemia, heart murmur, DKA, hidradenitis suppurativa Home  Medications Prior to Admission medications   Medication Sig Start Date End Date Taking? Authorizing Provider  atorvastatin (LIPITOR) 40 MG tablet Take 40 mg by mouth daily. 12/11/21   [provider]  B-D ULTRAFINE III SHORT PEN 31G X 8 MM MISC See admin instructions. 03/11/17   [provider]  buPROPion (WELLBUTRIN XL) 300 MG 24 hr tablet TAKE 1 TABLET(300 MG) BY MOUTH DAILY 02/26/17   Myrlene Broker, MD  citalopram (CELEXA) 40 MG tablet TAKE 1 TABLET(40 MG) BY MOUTH DAILY 02/26/17   Myrlene Broker, MD  fluticasone [facility]) 50 MCG/ACT nasal spray Place 2 sprays into both nostrils daily. Patient taking differently: Place 2 sprays into both nostrils daily as needed for allergies. 05/24/17   Nche, Bonna Gains, NP  glipiZIDE (GLUCOTROL) 10 MG tablet Take 10 mg by mouth 2 (two) times daily. 12/11/21   [provider]  glucose blood (ACCU-CHEK AVIVA PLUS) test strip 1 each by Other route 2 (two) times daily. Use to check blood sugar twice a day. Dx E11.9 08/27/16   Myrlene Broker, MD  guanFACINE (INTUNIV) 2 MG TB24 ER tablet Take 2 mg by mouth daily. 09/09/21   [provider]  hydrOXYzine (VISTARIL) 25 MG capsule TAKE 2 CAPSULES(50 MG) BY MOUTH EVERY 6 HOURS AS NEEDED FOR ANXIETY Patient taking differently: 50 mg 2 (two) times daily. 05/14/17   Myrlene Broker, MD  hyoscyamine (LEVSIN) 0.125 MG tablet Take 1 tablet (0.125 mg total) by mouth every 8 (eight) hours as needed for cramping. 01/14/22   Petrucelli, Pleas Koch, PA-C  Insulin Pen Needle (SURE COMFORT PEN NEEDLES) 30G X 8 MM MISC Use for injections 08/04/16   Myrlene Broker, MD  lisdexamfetamine (VYVANSE) 60 MG capsule Take 1 capsule by mouth daily. 07/21/22     lisinopril (ZESTRIL) 2.5 MG tablet Take 2.5 mg by mouth daily. 12/12/21   [provider]  montelukast (SINGULAIR) 10 MG tablet Take 1 tablet (10 mg total) by mouth daily. Patient taking differently: Take 10 mg by mouth at  bedtime. 04/15/16   Myrlene Broker, MD  ondansetron (ZOFRAN) 4 MG tablet Take 1 tablet (4 mg total) by mouth every 6 (six) hours. 01/12/22   Wynetta Fines, MD  pantoprazole (PROTONIX) 40 MG tablet Take 1 tablet (40 mg total) by mouth daily. 01/14/22   Petrucelli, Pleas Koch, PA-C  promethazine (PHENERGAN) 25 MG tablet Take 1 tablet (25 mg total) by mouth every 8 (eight) hours as needed for nausea or vomiting. 01/14/22   Petrucelli, Samantha R, PA-C  sodium chloride (OCEAN) 0.65 % SOLN nasal spray Place 1 spray into both nostrils as needed for congestion. 05/24/17   Nche, Bonna Gains, NP  traZODone (DESYREL) 150 MG tablet Take 150 mg by mouth at bedtime. 05/14/17   [provider]  VYVANSE 60 MG capsule Take 60 mg by mouth daily. 12/19/21   [provider]      Allergies    Metformin and related, Other, Oxycodone, and Pioglitazone    Review of Systems   Review of Systems  All other systems reviewed and are negative.   Physical Exam Updated Vital Signs There were no vitals taken for this visit. Physical Exam Vitals and nursing note reviewed.  Constitutional:      General: She is not in acute distress.    Appearance: She is well-developed.  HENT:     Head: Normocephalic and atraumatic.  Eyes:     Conjunctiva/sclera: Conjunctivae normal.  Cardiovascular:     Rate and Rhythm: Normal rate and regular rhythm.     Heart sounds: No murmur heard. Pulmonary:     Effort: Pulmonary effort is normal. No respiratory distress.     Breath sounds: Normal breath sounds. No wheezing, rhonchi or rales.  Abdominal:     Palpations: Abdomen is soft.     Tenderness: There is no abdominal tenderness.  Musculoskeletal:        General: No swelling.     Cervical back: Neck supple.     Comments: Pain elicited with range of motion of left hip.  Tenderness lateral most proximal aspect of hip.  Pedal pulses 2+ bilaterally.  No overlying erythema, palpable fluctuance/induration.  Skin:     General: Skin is warm and dry.     Capillary Refill: Capillary refill takes less than 2 seconds.  Neurological:     Mental Status: She is alert.     Comments: Alert and oriented to self, place, time and event.   Speech is fluent, clear without dysarthria or dysphasia.   Strength 5/5 in upper/lower extremities   Sensation intact in upper/lower extremities   Normal gait.  CN I not tested  CN II not tested CN III, IV, VI PERRLA and EOMs intact bilaterally  CN V Intact sensation to sharp and light touch to the face  CN VII facial movements symmetric  CN VIII not tested  CN IX, X no uvula deviation, symmetric rise of soft palate  CN XI 5/5 SCM and trapezius strength bilaterally  CN XII Midline tongue protrusion, symmetric L/R movements  Psychiatric:        Mood and Affect: Mood normal.     ED Results / Procedures / Treatments   Labs (all labs ordered are listed, but only abnormal results are displayed) Labs Reviewed - No data to display  EKG None  Radiology No results found.  Procedures Procedures  {Document cardiac monitor, telemetry assessment procedure when appropriate:1}  Medications Ordered in ED Medications - No data to display  ED Course/ Medical Decision Making/ A&P   {   Click here for ABCD2, HEART and other calculatorsREFRESH Note before signing :1}                              Medical Decision Making Amount and/or Complexity of Data Reviewed Labs: ordered. Radiology: ordered.  Risk Prescription drug management.   This patient presents to the ED for concern of syncope, this involves an extensive number of treatment options, and is a complaint that carries with it a high risk of complications and morbidity.  The differential diagnosis includes orthostatic hypotension, vasovagal, arrhythmia, structural heart disease, incidental injury, dehydration, hypoglycemia, other   Co morbidities that complicate the patient evaluation  See  HPI   Additional history obtained:  Additional history obtained from EMR External records from outside source obtained and reviewed including hospital records   Lab Tests:  I Ordered, and personally interpreted labs.  The pertinent results include: Hyponatremia of 133.  Decrease in bicarb 20.  Otherwise, lites within normal limits.  No transaminitis.  No renal dysfunction.  Leukocytosis of 17.  No evidence of anemia.  Platelets within range.***   Imaging Studies ordered:  I ordered imaging studies including pelvis and left hip I independently visualized and interpreted imaging which showed *** I agree with the radiologist interpretation   Cardiac Monitoring: / EKG:  The patient was maintained on a cardiac monitor.  I personally viewed and interpreted the cardiac monitored which showed an underlying rhythm of: Sinus rhythm   Consultations Obtained:  N/a   Problem List / ED Course / Critical interventions / Medication management  Left hip pain, syncope I ordered medication including Toradol  Reevaluation of the patient after these medicines showed that the patient improved I have reviewed the patients home medicines and have made adjustments as needed   Social Determinants of Health:  Cigarette use.  Denies illicit drug use.   Test / Admission - Considered:  Left hip pain, syncope Vitals signs within normal range and stable throughout visit. Laboratory/imaging studies significant for: See above *** Worrisome signs and symptoms were discussed with the patient, and the patient acknowledged understanding to return to the ED if noticed. Patient was stable upon discharge.    {Document critical care time when appropriate:1} {Document review of labs and clinical decision tools ie heart score, Chads2Vasc2 etc:1}  {Document your independent review of radiology images, and any outside records:1} {Document your discussion with family members, caretakers, and with  consultants:1} {Document social determinants of health affecting pt's care:1} {Document your decision making why or why not admission, treatments were needed:1} Final Clinical Impression(s) / ED Diagnoses Final diagnoses:  None    Rx / DC Orders ED Discharge Orders     None

## 2023-09-21 NOTE — ED Triage Notes (Addendum)
Presents from home via EMS for syncopal episode she estimates as lasting 3-5 min.  Endorses pinching a nerve at work in her hip, went home and smoked marijuana, after this became diaphoretic and passed out. No h/o previous similar episodes. Endorses 7/10 L hip pain, denies CP or SOB. When EMS arrived was A&Ox4.  EMS exam: increased dizziness with position changes, CBG 219, 130/80, 86bpm, NSR  H/o DM II, HLD, HTN,anxiety, asthma

## 2023-09-21 NOTE — Discharge Instructions (Addendum)
As discussed, I suspect your symptoms are likely secondary to orthostatic hypotension.  Recommend adequate oral hydration as well as getting up slowly from a seated position.  Will send a medication for treatment of your left hip pain.  Recommend follow-up with orthopedics for reassessment.  Please do not hesitate to return if the worrisome signs and symptoms we discussed become apparent.
